# Patient Record
Sex: Female | Born: 1995 | Race: White | Hispanic: No | Marital: Single | Smoking: Former smoker
Health system: Southern US, Community
[De-identification: ages and names within clinical notes are randomized; demographics above are authoritative.]

## PROBLEM LIST (undated history)

## (undated) DIAGNOSIS — F1910 Other psychoactive substance abuse, uncomplicated: Secondary | ICD-10-CM

## (undated) DIAGNOSIS — F4310 Post-traumatic stress disorder, unspecified: Secondary | ICD-10-CM

## (undated) DIAGNOSIS — N926 Irregular menstruation, unspecified: Secondary | ICD-10-CM

## (undated) DIAGNOSIS — F320 Major depressive disorder, single episode, mild: Secondary | ICD-10-CM

## (undated) DIAGNOSIS — F319 Bipolar disorder, unspecified: Secondary | ICD-10-CM

## (undated) DIAGNOSIS — R87619 Unspecified abnormal cytological findings in specimens from cervix uteri: Secondary | ICD-10-CM

## (undated) HISTORY — PX: MOUTH SURGERY: SHX715

## (undated) HISTORY — DX: Unspecified abnormal cytological findings in specimens from cervix uteri: R87.619

## (undated) HISTORY — DX: Other psychoactive substance abuse, uncomplicated: F19.10

## (undated) HISTORY — DX: Irregular menstruation, unspecified: N92.6

## (undated) HISTORY — DX: Bipolar disorder, unspecified: F31.9

## (undated) HISTORY — PX: NO PAST SURGERIES: SHX2092

## (undated) HISTORY — DX: Post-traumatic stress disorder, unspecified: F43.10

---
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
## 2015-03-08 ENCOUNTER — Ambulatory Visit (INDEPENDENT_AMBULATORY_CARE_PROVIDER_SITE_OTHER): Payer: 59 | Admitting: *Deleted

## 2015-03-08 VITALS — BP 100/70 | HR 80 | Ht 64.75 in | Wt 114.4 lb

## 2015-03-08 DIAGNOSIS — Z23 Encounter for immunization: Secondary | ICD-10-CM

## 2015-03-08 DIAGNOSIS — D509 Iron deficiency anemia, unspecified: Secondary | ICD-10-CM

## 2015-03-08 LAB — CBC
HCT: 37 % (ref 36.0–46.0)
Hemoglobin: 12.4 g/dL (ref 12.0–15.0)
MCH: 27.3 pg (ref 26.0–34.0)
MCHC: 33.5 g/dL (ref 30.0–36.0)
MCV: 81.5 fL (ref 78.0–100.0)
MPV: 10.2 fL (ref 8.6–12.4)
Platelets: 249 10*3/uL (ref 150–400)
RBC: 4.54 MIL/uL (ref 3.87–5.11)
RDW: 15.7 % — ABNORMAL HIGH (ref 11.5–15.5)
WBC: 5.4 10*3/uL (ref 4.0–10.5)

## 2015-03-08 LAB — FERRITIN: Ferritin: 21 ng/mL (ref 10–291)

## 2015-03-08 NOTE — Progress Notes (Signed)
Patient is here for her 3rd Gardasil Injection No problems or issues with previous Gardasil  LMP: 03/07/15  Patient tolerated injection well in Right Deltoid Routed to provider for review, encounter closed.

## 2015-03-08 NOTE — Addendum Note (Signed)
Addended by: Lorraine LaxSHAW, Trayshawn Durkin J on: 03/08/2015 08:52 AM   Modules accepted: Level of Service

## 2015-03-12 LAB — IBC PANEL
%SAT: 8 % — ABNORMAL LOW (ref 20–55)
TIBC: 323 ug/dL (ref 250–470)
UIBC: 296 ug/dL (ref 125–400)

## 2015-03-12 LAB — IRON: Iron: 27 ug/dL — ABNORMAL LOW (ref 42–145)

## 2015-03-13 ENCOUNTER — Other Ambulatory Visit: Payer: Self-pay | Admitting: Certified Nurse Midwife

## 2015-03-13 ENCOUNTER — Telehealth: Payer: Self-pay

## 2015-03-13 DIAGNOSIS — D509 Iron deficiency anemia, unspecified: Secondary | ICD-10-CM

## 2015-03-13 NOTE — Telephone Encounter (Signed)
-----   Message from Verner Choleborah S Leonard, CNM sent at 03/13/2015  8:15 AM EDT ----- Notify patient that her CBC is essentially normal Ferritin level low normal  Now 21 normal 10- 291 Iron saturation still low at 8 normal 20-55 % Iron level still low at 27 normal is 42-145  Is she still taking her iron?? If so take, change over to slow release iron capsules and take at night with fruit juice for better absorption Recheck 2 months order, schedule please

## 2015-03-13 NOTE — Telephone Encounter (Signed)
lmtcb

## 2015-03-16 NOTE — Telephone Encounter (Signed)
Patient notified of results. See lab 

## 2015-07-23 ENCOUNTER — Ambulatory Visit (INDEPENDENT_AMBULATORY_CARE_PROVIDER_SITE_OTHER): Payer: 59 | Admitting: Nurse Practitioner

## 2015-07-23 ENCOUNTER — Encounter: Payer: Self-pay | Admitting: Nurse Practitioner

## 2015-07-23 VITALS — BP 114/70 | HR 72 | Resp 16 | Ht 66.5 in | Wt 114.0 lb

## 2015-07-23 DIAGNOSIS — D509 Iron deficiency anemia, unspecified: Secondary | ICD-10-CM

## 2015-07-23 DIAGNOSIS — Z0000 Encounter for general adult medical examination without abnormal findings: Secondary | ICD-10-CM | POA: Diagnosis not present

## 2015-07-23 DIAGNOSIS — Z01419 Encounter for gynecological examination (general) (routine) without abnormal findings: Secondary | ICD-10-CM | POA: Diagnosis not present

## 2015-07-23 LAB — CBC WITH DIFFERENTIAL/PLATELET
Basophils Absolute: 0 10*3/uL (ref 0.0–0.1)
Basophils Relative: 0 % (ref 0–1)
Eosinophils Absolute: 0.1 10*3/uL (ref 0.0–0.7)
Eosinophils Relative: 1 % (ref 0–5)
HCT: 34.3 % — ABNORMAL LOW (ref 36.0–46.0)
Hemoglobin: 11.7 g/dL — ABNORMAL LOW (ref 12.0–15.0)
Lymphocytes Relative: 24 % (ref 12–46)
Lymphs Abs: 1.5 10*3/uL (ref 0.7–4.0)
MCH: 27.9 pg (ref 26.0–34.0)
MCHC: 34.1 g/dL (ref 30.0–36.0)
MCV: 81.9 fL (ref 78.0–100.0)
MPV: 10.3 fL (ref 8.6–12.4)
Monocytes Absolute: 0.4 10*3/uL (ref 0.1–1.0)
Monocytes Relative: 7 % (ref 3–12)
Neutro Abs: 4.4 10*3/uL (ref 1.7–7.7)
Neutrophils Relative %: 68 % (ref 43–77)
Platelets: 246 10*3/uL (ref 150–400)
RBC: 4.19 MIL/uL (ref 3.87–5.11)
RDW: 13.9 % (ref 11.5–15.5)
WBC: 6.4 10*3/uL (ref 4.0–10.5)

## 2015-07-23 LAB — POCT URINALYSIS DIPSTICK
Bilirubin, UA: NEGATIVE
Blood, UA: NEGATIVE
Glucose, UA: NEGATIVE
Ketones, UA: NEGATIVE
Leukocytes, UA: NEGATIVE
Nitrite, UA: NEGATIVE
Protein, UA: NEGATIVE
Urobilinogen, UA: NEGATIVE
pH, UA: 5

## 2015-07-23 NOTE — Progress Notes (Signed)
19 y.o. G0P0 Single  Caucasian Fe here for annual exam.  Now on Kariva since January.  Menses now 4 days.moderate to light.  Slight cramps. Same partner for 11 months. No vaginal symptoms. Declines STD's.  Taking iron tablets every once in a while.  Patient's last menstrual period was 06/25/2015 (exact date).          Sexually active: Yes.    The current method of family planning is OCP- Garnette Scheuermann (estrogen/progesterone).    Exercising: No.  n/a Smoker:  no  Health Maintenance: Pap:  never TDaP:  Pt is unsure Labs:  Pt would like labs drawn today after office visit.  UA: Neg.   reports that she has never smoked. She has never used smokeless tobacco. She reports that she does not use illicit drugs.  Past Medical History  Diagnosis Date  . Irregular menstrual cycle 06/19/2014    Past Surgical History  Procedure Laterality Date  . Mouth surgery  4 years ago     Current Outpatient Prescriptions  Medication Sig Dispense Refill  . desogestrel-ethinyl estradiol (KARIVA) 0.15-0.02/0.01 MG (21/5) tablet Take 1 tablet by mouth daily. 3 Package 2  . ibuprofen (ADVIL,MOTRIN) 200 MG tablet Take 200 mg by mouth every 6 (six) hours as needed. For pain     No current facility-administered medications for this visit.    Family History  Problem Relation Age of Onset  . Prostate cancer Father   . Diabetes Mellitus II Paternal Grandmother   . Diabetes Mellitus II Paternal Grandfather   . Diabetes Mellitus II Father   . Depression Paternal Grandmother   . Fibroids Maternal Grandmother   . Hypertension Paternal Grandmother   . Hypertension Paternal Grandfather   . Hypertension Father   . Osteoporosis Maternal Grandmother     ROS:  Pertinent items are noted in HPI.  Otherwise, a comprehensive ROS was negative.  Exam:   BP 114/70 mmHg  Pulse 72  Resp 16  Ht 5' 6.5" (1.689 m)  Wt 114 lb (51.71 kg)  BMI 18.13 kg/m2  LMP 06/25/2015 (Exact Date) Height: 5' 6.5" (168.9 cm) Ht Readings from  Last 3 Encounters:  07/23/15 5' 6.5" (1.689 m) (81 %*, Z = 0.87)  03/08/15 5' 4.75" (1.645 m) (58 %*, Z = 0.19)  12/05/14 5' 4.75" (1.645 m) (58 %*, Z = 0.20)   * Growth percentiles are based on CDC 2-20 Years data.    General appearance: alert, cooperative and appears stated age Head: Normocephalic, without obvious abnormality, atraumatic Neck: no adenopathy, supple, symmetrical, trachea midline and thyroid normal to inspection and palpation Lungs: clear to auscultation bilaterally Breasts: normal appearance, no masses or tenderness Heart: regular rate and rhythm Abdomen: soft, non-tender; no masses,  no organomegaly Extremities: extremities normal, atraumatic, no cyanosis or edema Skin: Skin color, texture, turgor normal. No rashes or lesions Lymph nodes: Cervical, supraclavicular, and axillary nodes normal. No abnormal inguinal nodes palpated Neurologic: Grossly normal   Pelvic: External genitalia:  no lesions              Urethra:  normal appearing urethra with no masses, tenderness or lesions              Bartholin's and Skene's: normal                 Vagina: normal appearing vagina with normal color and discharge, no lesions              Cervix: anteverted  Pap taken: No. Bimanual Exam:  Uterus:  normal size, contour, position, consistency, mobility, non-tender              Adnexa: no mass, fullness, tenderness               Rectovaginal: Confirms               Anus:  normal sphincter tone, no lesions  Chaperone present: No  A:  Well Woman with normal exam  Contraception with OCP  Iron deficiency anemia   P:   Reviewed health and wellness pertinent to exam  Pap smear as above  Refill on Kariva for a year  Follow with CBC  Counseled on STD prevention, use and side effects of OCP's, adequate intake of calcium and vitamin D, diet and exercise return annually or prn  An After Visit Summary was printed and given to the patient.

## 2015-07-23 NOTE — Patient Instructions (Addendum)
General topics  Next pap or exam is  due in 1 year Take a Women's multivitamin Take 1200 mg. of calcium daily - prefer dietary If any concerns in interim to call back  Breast Self-Awareness Practicing breast self-awareness may pick up problems early, prevent significant medical complications, and possibly save your life. By practicing breast self-awareness, you can become familiar with how your breasts look and feel and if your breasts are changing. This allows you to notice changes early. It can also offer you some reassurance that your breast health is good. One way to learn what is normal for your breasts and whether your breasts are changing is to do a breast self-exam. If you find a lump or something that was not present in the past, it is best to contact your caregiver right away. Other findings that should be evaluated by your caregiver include nipple discharge, especially if it is bloody; skin changes or reddening; areas where the skin seems to be pulled in (retracted); or new lumps and bumps. Breast pain is seldom associated with cancer (malignancy), but should also be evaluated by a caregiver. BREAST SELF-EXAM The best time to examine your breasts is 5 7 days after your menstrual period is over.  ExitCare Patient Information 2013 [employer].   Exercise to Stay Healthy Exercise helps you become and stay healthy. EXERCISE IDEAS AND TIPS Choose exercises that:  You enjoy.  Fit into your day. You do not need to exercise really hard to be healthy. You can do exercises at a slow or medium level and stay healthy. You can:  Stretch before and after working out.  Try yoga, Pilates, or tai chi.  Lift weights.  Walk fast, swim, jog, run, climb stairs, bicycle, dance, or rollerskate.  Take aerobic classes. Exercises that burn about 150 calories:  Running 1  miles in 15 minutes.  Playing volleyball for 45 to 60 minutes.  Washing and waxing a car for 45 to 60  minutes.  Playing touch football for 45 minutes.  Walking 1  miles in 35 minutes.  Pushing a stroller 1  miles in 30 minutes.  Playing basketball for 30 minutes.  Raking leaves for 30 minutes.  Bicycling 5 miles in 30 minutes.  Walking 2 miles in 30 minutes.  Dancing for 30 minutes.  Shoveling snow for 15 minutes.  Swimming laps for 20 minutes.  Walking up stairs for 15 minutes.  Bicycling 4 miles in 15 minutes.  Gardening for 30 to 45 minutes.  Jumping rope for 15 minutes.  Washing windows or floors for 45 to 60 minutes. Document Released: 12/27/2010 Document Revised: 02/16/2012 Document Reviewed: 12/27/2010 ExitCare Patient Information 2013 [employer].   Other topics ( that may be useful information):    Sexually Transmitted Disease Sexually transmitted disease (STD) refers to any infection that is passed from person to person during sexual activity. This may happen by way of saliva, semen, blood, vaginal mucus, or urine. Common STDs include:  Gonorrhea.  Chlamydia.  Syphilis.  HIV/AIDS.  Genital herpes.  Hepatitis B and C.  Trichomonas.  Human papillomavirus (HPV).  Pubic lice. CAUSES  An STD may be spread by bacteria, virus, or parasite. A person can get an STD by:  Sexual intercourse with an infected person.  Sharing sex toys with an infected person.  Sharing needles with an infected person.  Having intimate contact with the genitals, mouth, or rectal areas of an infected person. SYMPTOMS  Some people may not have any symptoms, but   they can still pass the infection to others. Different STDs have different symptoms. Symptoms include:  Painful or bloody urination.  Pain in the pelvis, abdomen, vagina, anus, throat, or eyes.  Skin rash, itching, irritation, growths, or sores (lesions). These usually occur in the genital or anal area.  Abnormal vaginal discharge.  Penile discharge in men.  Soft, flesh-colored skin growths in the  genital or anal area.  Fever.  Pain or bleeding during sexual intercourse.  Swollen glands in the groin area.  Yellow skin and eyes (jaundice). This is seen with hepatitis. DIAGNOSIS  To make a diagnosis, your caregiver may:  Take a medical history.  Perform a physical exam.  Take a specimen (culture) to be examined.  Examine a sample of discharge under a microscope.  Perform blood test TREATMENT   Chlamydia, gonorrhea, trichomonas, and syphilis can be cured with antibiotic medicine.  Genital herpes, hepatitis, and HIV can be treated, but not cured, with prescribed medicines. The medicines will lessen the symptoms.  Genital warts from HPV can be treated with medicine or by freezing, burning (electrocautery), or surgery. Warts may come back.  HPV is a virus and cannot be cured with medicine or surgery.However, abnormal areas may be followed very closely by your caregiver and may be removed from the cervix, vagina, or vulva through office procedures or surgery. If your diagnosis is confirmed, your recent sexual partners need treatment. This is true even if they are symptom-free or have a negative culture or evaluation. They should not have sex until their caregiver says it is okay. HOME CARE INSTRUCTIONS  All sexual partners should be informed, tested, and treated for all STDs.  Take your antibiotics as directed. Finish them even if you start to feel better.  Only take over-the-counter or prescription medicines for pain, discomfort, or fever as directed by your caregiver.  Rest.  Eat a balanced diet and drink enough fluids to keep your urine clear or pale yellow.  Do not have sex until treatment is completed and you have followed up with your caregiver. STDs should be checked after treatment.  Keep all follow-up appointments, Pap tests, and blood tests as directed by your caregiver.  Only use latex condoms and water-soluble lubricants during sexual activity. Do not use  petroleum jelly or oils.  Avoid alcohol and illegal drugs.  Get vaccinated for HPV and hepatitis. If you have not received these vaccines in the past, talk to your caregiver about whether one or both might be right for you.  Avoid risky sex practices that can break the skin. The only way to avoid getting an STD is to avoid all sexual activity.Latex condoms and dental dams (for oral sex) will help lessen the risk of getting an STD, but will not completely eliminate the risk. SEEK MEDICAL CARE IF:   You have a fever.  You have any new or worsening symptoms. Document Released: 02/14/2003 Document Revised: 02/16/2012 Document Reviewed: 02/21/2011 [facility] Patient Information 2013 [place].    Domestic Abuse You are being battered or abused if someone close to you hits, pushes, or physically hurts you in any way. You also are being abused if you are forced into activities. You are being sexually abused if you are forced to have sexual contact of any kind. You are being emotionally abused if you are made to feel worthless or if you are constantly threatened. It is important to remember that help is available. No one has the right to abuse you. PREVENTION OF FURTHER  ABUSE  Learn the warning signs of danger. This varies with situations but may include: the use of alcohol, threats, isolation from friends and family, or forced sexual contact. Leave if you feel that violence is going to occur.  If you are attacked or beaten, report it to the police so the abuse is documented. You do not have to press charges. The police can protect you while you or the attackers are leaving. Get the officer's name and badge number and a copy of the report.  Find someone you can trust and tell them what is happening to you: your caregiver, a nurse, clergy member, close friend or family member. Feeling ashamed is natural, but remember that you have done nothing wrong. No one deserves abuse. Document Released:  11/21/2000 Document Revised: 02/16/2012 Document Reviewed: 01/30/2011 ExitCare Patient Information 2013 [employer].    How Much is Too Much Alcohol? Drinking too much alcohol can cause injury, accidents, and health problems. These types of problems can include:   Car crashes.  Falls.  Family fighting (domestic violence).  Drowning.  Fights.  Injuries.  Burns.  Damage to certain organs.  Having a baby with birth defects. ONE DRINK CAN BE TOO MUCH WHEN YOU ARE:  Working.  Pregnant or breastfeeding.  Taking medicines. Ask your doctor.  Driving or planning to drive. If you or someone you know has a drinking problem, get help from a doctor.  Document Released: 09/20/2009 Document Revised: 02/16/2012 Document Reviewed: 09/20/2009 ExitCare Patient Information 2013 [employer].   Smoking Hazards Smoking cigarettes is extremely bad for your health. Tobacco smoke has over 200 known poisons in it. There are over 60 chemicals in tobacco smoke that cause cancer. Some of the chemicals found in cigarette smoke include:   Cyanide.  Benzene.  Formaldehyde.  Methanol (wood alcohol).  Acetylene (fuel used in welding torches).  Ammonia. Cigarette smoke also contains the poisonous gases nitrogen oxide and carbon monoxide.  Cigarette smokers have an increased risk of many serious medical problems and Smoking causes approximately:  90% of all lung cancer deaths in men.  80% of all lung cancer deaths in women.  90% of deaths from chronic obstructive lung disease. Compared with nonsmokers, smoking increases the risk of:  Coronary heart disease by 2 to 4 times.  Stroke by 2 to 4 times.  Men developing lung cancer by 23 times.  Women developing lung cancer by 13 times.  Dying from chronic obstructive lung diseases by 12 times.  . Smoking is the most preventable cause of death and disease in our society.  WHY IS SMOKING ADDICTIVE?  Nicotine is the chemical  agent in tobacco that is capable of causing addiction or dependence.  When you smoke and inhale, nicotine is absorbed rapidly into the bloodstream through your lungs. Nicotine absorbed through the lungs is capable of creating a powerful addiction. Both inhaled and non-inhaled nicotine may be addictive.  Addiction studies of cigarettes and spit tobacco show that addiction to nicotine occurs mainly during the teen years, when young people begin using tobacco products. WHAT ARE THE BENEFITS OF QUITTING?  There are many health benefits to quitting smoking.   Likelihood of developing cancer and heart disease decreases. Health improvements are seen almost immediately.  Blood pressure, pulse rate, and breathing patterns start returning to normal soon after quitting. QUITTING SMOKING   American Lung Association - 1-800-LUNGUSA  American Cancer Society - 1-800-ACS-2345 Document Released: 01/01/2005 Document Revised: 02/16/2012 Document Reviewed: 09/05/2009 ExitCare Patient Information 2013 ExitCare,   LLC.   Stress Management Stress is a state of physical or mental tension that often results from changes in your life or normal routine. Some common causes of stress are:  Death of a loved one.  Injuries or severe illnesses.  Getting fired or changing jobs.  Moving into a new home. Other causes may be:  Sexual problems.  Business or financial losses.  Taking on a large debt.  Regular conflict with someone at home or at work.  Constant tiredness from lack of sleep. It is not just bad things that are stressful. It may be stressful to:  Win the lottery.  Get married.  Buy a new car. The amount of stress that can be easily tolerated varies from person to person. Changes generally cause stress, regardless of the types of change. Too much stress can affect your health. It may lead to physical or emotional problems. Too little stress (boredom) may also become stressful. SUGGESTIONS TO  REDUCE STRESS:  Talk things over with your family and friends. It often is helpful to share your concerns and worries. If you feel your problem is serious, you may want to get help from a professional counselor.  Consider your problems one at a time instead of lumping them all together. Trying to take care of everything at once may seem impossible. List all the things you need to do and then start with the most important one. Set a goal to accomplish 2 or 3 things each day. If you expect to do too many in a single day you will naturally fail, causing you to feel even more stressed.  Do not use alcohol or drugs to relieve stress. Although you may feel better for a short time, they do not remove the problems that caused the stress. They can also be habit forming.  Exercise regularly - at least 3 times per week. Physical exercise can help to relieve that "uptight" feeling and will relax you.  The shortest distance between despair and hope is often a good night's sleep.  Go to bed and get up on time allowing yourself time for appointments without being rushed.  Take a short "time-out" period from any stressful situation that occurs during the day. Close your eyes and take some deep breaths. Starting with the muscles in your face, tense them, hold it for a few seconds, then relax. Repeat this with the muscles in your neck, shoulders, hand, stomach, back and legs.  Take good care of yourself. Eat a balanced diet and get plenty of rest.  Schedule time for having fun. Take a break from your daily routine to relax. HOME CARE INSTRUCTIONS   Call if you feel overwhelmed by your problems and feel you can no longer manage them on your own.  Return immediately if you feel like hurting yourself or someone else. Document Released: 05/20/2001 Document Revised: 02/16/2012 Document Reviewed: 01/10/2008 [facility] Patient Information 2013 [place].   Please check on date for TDaP

## 2015-07-24 NOTE — Progress Notes (Signed)
Encounter reviewed by Dr. Brook Amundson C. Silva.  

## 2015-10-07 ENCOUNTER — Emergency Department (HOSPITAL_COMMUNITY)
Admission: EM | Admit: 2015-10-07 | Discharge: 2015-10-08 | Disposition: A | Discharge status: Home / Self Care | Payer: 59 | Attending: Emergency Medicine | Admitting: Emergency Medicine

## 2015-10-07 ENCOUNTER — Encounter (HOSPITAL_COMMUNITY): Payer: Self-pay

## 2015-10-07 ENCOUNTER — Ambulatory Visit (HOSPITAL_COMMUNITY)
Admission: EM | Admit: 2015-10-07 | Discharge: 2015-10-07 | Disposition: A | Discharge status: Home / Self Care | Payer: 59 | Source: Home / Self Care | Attending: Emergency Medicine | Admitting: Emergency Medicine

## 2015-10-07 DIAGNOSIS — Z3202 Encounter for pregnancy test, result negative: Secondary | ICD-10-CM | POA: Insufficient documentation

## 2015-10-07 DIAGNOSIS — Z880 Allergy status to penicillin: Secondary | ICD-10-CM | POA: Diagnosis not present

## 2015-10-07 DIAGNOSIS — T7421XA Adult sexual abuse, confirmed, initial encounter: Secondary | ICD-10-CM | POA: Diagnosis not present

## 2015-10-07 DIAGNOSIS — Z8742 Personal history of other diseases of the female genital tract: Secondary | ICD-10-CM | POA: Diagnosis not present

## 2015-10-07 DIAGNOSIS — Z79818 Long term (current) use of other agents affecting estrogen receptors and estrogen levels: Secondary | ICD-10-CM | POA: Diagnosis not present

## 2015-10-07 MED ORDER — PROMETHAZINE HCL 25 MG PO TABS
25.0000 mg | ORAL_TABLET | Freq: Four times a day (QID) | ORAL | Status: DC | PRN
Start: 2015-10-07 — End: 2015-10-08
  Administered 2015-10-07: 75 mg via ORAL

## 2015-10-07 MED ORDER — AZITHROMYCIN 1 G PO PACK
PACK | ORAL | Status: AC
  Administered 2015-10-07: 1 g via ORAL
  Filled 2015-10-07: qty 1

## 2015-10-07 MED ORDER — ULIPRISTAL ACETATE 30 MG PO TABS
ORAL_TABLET | ORAL | Status: AC
  Filled 2015-10-07: qty 1

## 2015-10-07 MED ORDER — PROMETHAZINE HCL 25 MG PO TABS
ORAL_TABLET | ORAL | Status: AC
  Administered 2015-10-07: 75 mg via ORAL
  Filled 2015-10-07: qty 3

## 2015-10-07 MED ORDER — METRONIDAZOLE 500 MG PO TABS
2000.0000 mg | ORAL_TABLET | Freq: Once | ORAL | Status: AC
  Administered 2015-10-07: 2000 mg via ORAL
  Filled 2015-10-07: qty 4

## 2015-10-07 MED ORDER — METRONIDAZOLE 500 MG PO TABS
ORAL_TABLET | ORAL | Status: AC
  Filled 2015-10-07: qty 4

## 2015-10-07 MED ORDER — CEFIXIME 400 MG PO CAPS
ORAL_CAPSULE | ORAL | Status: AC
  Filled 2015-10-07: qty 1

## 2015-10-07 MED ORDER — AZITHROMYCIN 1 G PO PACK
1.0000 g | PACK | Freq: Once | ORAL | Status: AC
  Administered 2015-10-07: 1 g via ORAL
  Filled 2015-10-07: qty 1
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
## 2015-10-07 NOTE — ED Notes (Signed)
SANE RN bedside with Pt.

## 2015-10-08 LAB — POC URINE PREG, ED: Preg Test, Ur: NEGATIVE

## 2015-10-11 ENCOUNTER — Telehealth: Payer: Self-pay | Admitting: Nurse Practitioner

## 2015-10-11 NOTE — Telephone Encounter (Signed)
Patient will need to be seen by a medical provider or psychiatrist for evaluation and treatment.  We feel this is very important for our young women who are experiencing acute mental health issues.  If acutely not doing well, needs to be seen at [facility].  If we have permission to discuss care with [name], we may be able to facilitate more easily.
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
## 2015-10-11 NOTE — Telephone Encounter (Signed)
Spoke with patient's mother, Shawna OrleansMelanie. Advised of message as seen below from Dr.Silva. Mother is agreeable. Mother provided me with permission to speak with [name] in regards to her recommendations for a psychiatrist in the area. Advised I will speak with [name] and return call. Left message at the office of [name] at [phone] for return call to discuss recommendations.
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
## 2015-10-12 NOTE — Telephone Encounter (Signed)
Attempted to reach [name] [name] again at office number [phone]. There was no answer. Spoke with patient's mother [name] [name]. Advised I have not received a return call from [name] [name]. Advised I have looked into psychiatry groups in [place], [place]. She may call [facility] to schedule an appointment for her daughter with a Psychiatrist phone number is [phone]. Mother is agreeable. Will notify our office if she has any difficulty scheduling an appointment for her daughter. States that her daughter is stable.  Routing to Dr.Silva as FYI.

## 2015-10-12 NOTE — Telephone Encounter (Signed)
Thank you for the update. I agree with [facility].

## 2015-10-12 NOTE — Telephone Encounter (Signed)
Patient's mom calling asking for an update. I told patient [name] is waiting to hear from the psychiatrist per last note. Patient's mom "Melaine" is asking  what can be done to speed this along?

## 2015-10-14 ENCOUNTER — Encounter (HOSPITAL_COMMUNITY): Payer: Self-pay | Admitting: *Deleted

## 2015-10-14 ENCOUNTER — Inpatient Hospital Stay (HOSPITAL_COMMUNITY)
Admission: RE | Admit: 2015-10-14 | Discharge: 2015-10-17 | DRG: 881 | Disposition: A | Discharge status: Home / Self Care | Payer: 59 | Attending: Psychiatry | Admitting: Psychiatry

## 2015-10-14 DIAGNOSIS — F4322 Adjustment disorder with anxiety: Secondary | ICD-10-CM | POA: Diagnosis present

## 2015-10-14 DIAGNOSIS — R45851 Suicidal ideations: Secondary | ICD-10-CM | POA: Diagnosis present

## 2015-10-14 DIAGNOSIS — Z8249 Family history of ischemic heart disease and other diseases of the circulatory system: Secondary | ICD-10-CM

## 2015-10-14 DIAGNOSIS — F329 Major depressive disorder, single episode, unspecified: Secondary | ICD-10-CM | POA: Diagnosis present

## 2015-10-14 DIAGNOSIS — Z833 Family history of diabetes mellitus: Secondary | ICD-10-CM | POA: Diagnosis not present

## 2015-10-14 DIAGNOSIS — F323 Major depressive disorder, single episode, severe with psychotic features: Secondary | ICD-10-CM | POA: Diagnosis not present

## 2015-10-14 DIAGNOSIS — G4700 Insomnia, unspecified: Secondary | ICD-10-CM | POA: Diagnosis present

## 2015-10-14 DIAGNOSIS — Z818 Family history of other mental and behavioral disorders: Secondary | ICD-10-CM | POA: Diagnosis not present

## 2015-10-14 DIAGNOSIS — F320 Major depressive disorder, single episode, mild: Secondary | ICD-10-CM

## 2015-10-14 HISTORY — DX: Major depressive disorder, single episode, mild: F32.0

## 2015-10-14 LAB — COMPREHENSIVE METABOLIC PANEL
ALT: 15 U/L (ref 14–54)
AST: 18 U/L (ref 15–41)
Albumin: 4.7 g/dL (ref 3.5–5.0)
Alkaline Phosphatase: 72 U/L (ref 38–126)
Anion gap: 9 (ref 5–15)
BUN: 16 mg/dL (ref 6–20)
CO2: 26 mmol/L (ref 22–32)
Calcium: 9.7 mg/dL (ref 8.9–10.3)
Chloride: 105 mmol/L (ref 101–111)
Creatinine, Ser: 0.72 mg/dL (ref 0.44–1.00)
GFR calc Af Amer: >60 mL/min (ref >60)
GFR calc non Af Amer: >60 mL/min (ref >60)
Glucose, Bld: 142 mg/dL — ABNORMAL HIGH (ref 65–99)
Potassium: 3.8 mmol/L (ref 3.5–5.1)
Sodium: 140 mmol/L (ref 135–145)
Total Bilirubin: 1 mg/dL (ref 0.3–1.2)
Total Protein: 7.8 g/dL (ref 6.5–8.1)

## 2015-10-14 LAB — PREGNANCY, URINE: Preg Test, Ur: NEGATIVE

## 2015-10-14 LAB — RAPID URINE DRUG SCREEN, HOSP PERFORMED
Amphetamines: NOT DETECTED
Barbiturates: NOT DETECTED
Benzodiazepines: NOT DETECTED
Cocaine: NOT DETECTED
Opiates: NOT DETECTED
Tetrahydrocannabinol: POSITIVE — AB

## 2015-10-14 MED ORDER — ACETAMINOPHEN 325 MG PO TABS: 650.0000 mg | ORAL_TABLET | Freq: Four times a day (QID) | ORAL | Status: DC | PRN

## 2015-10-14 MED ORDER — HYDROXYZINE HCL 25 MG PO TABS: 25.0000 mg | ORAL_TABLET | Freq: Four times a day (QID) | ORAL | Status: DC | PRN

## 2015-10-14 MED ORDER — BUPROPION HCL ER (XL) 150 MG PO TB24
150.0000 mg | ORAL_TABLET | Freq: Every day | ORAL | Status: DC
  Administered 2015-10-15: 150 mg via ORAL
  Filled 2015-10-14 (×5): qty 1

## 2015-10-14 MED ORDER — ALUM & MAG HYDROXIDE-SIMETH 200-200-20 MG/5ML PO SUSP: 30.0000 mL | ORAL | Status: DC | PRN

## 2015-10-14 MED ORDER — MAGNESIUM HYDROXIDE 400 MG/5ML PO SUSP: 30.0000 mL | Freq: Every day | ORAL | Status: DC | PRN

## 2015-10-14 MED ORDER — NICOTINE 14 MG/24HR TD PT24
14.0000 mg | MEDICATED_PATCH | Freq: Every day | TRANSDERMAL | Status: DC
  Filled 2015-10-14 (×2): qty 1

## 2015-10-14 MED ORDER — TRAZODONE HCL 50 MG PO TABS
50.0000 mg | ORAL_TABLET | Freq: Every evening | ORAL | Status: DC | PRN
  Administered 2015-10-15 – 2015-10-16 (×2): 50 mg via ORAL
  Filled 2015-10-14 (×2): qty 1
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
## 2015-10-14 NOTE — [facility] Group Notes (Signed)
[facility] Group Notes:  (Clinical Social Work)  10/14/2015  1:15-2:15PM  Summary of Progress/Problems:   The main focus of today's process group was to   1)  discuss the importance of adding supports  2)  define health supports versus unhealthy supports  3)  identify the patient's current unhealthy supports and plan how to handle them  4)  Identify the patient's current healthy supports and plan what to add.  An emphasis was placed on using counselor, doctor, therapy groups, 12-step groups, and problem-specific support groups to expand supports.    The patient expressed full comprehension of the concepts presented, and agreed that there is a need to add more supports.  The patient listened, did not talk much.  Type of Therapy:  Process Group with Motivational Interviewing  Participation Level:  Active  Participation Quality:  Attentive  Affect:  Blunted and Depressed  Cognitive:  Oriented  Insight:  Developing/Improving  Engagement in Therapy:  Engaged  Modes of Intervention:   Education, Support and Processing, Activity  Ambrose MantleMareida Grossman-Orr, LCSW 10/14/2015

## 2015-10-14 NOTE — Progress Notes (Signed)
Adult Psychoeducational Group Note  Date:  10/14/2015 Time:  8:58 PM  Group Topic/Focus:  Wrap-Up Group:   The focus of this group is to help patients review their daily goal of treatment and discuss progress on daily workbooks.  Participation Level:  Active  Participation Quality:  Appropriate and Attentive  Affect:  Appropriate  Cognitive:  Appropriate  Insight: Appropriate and Good  Engagement in Group:  Engaged  Modes of Intervention:  Education  Additional Comments:  Pt had a good day. Pt goal for tomorrow is to figure her work schedule out.   [name] [name] 10/14/2015, 8:58 PM
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
## 2015-10-14 NOTE — Progress Notes (Signed)
Writeer has observed patient up in the dayroom watching tv with no interaction with peers. Writer spoke with patient 1:1 and she is voiced no concerns and is settling in to the routine of the unit. Writer informed her of medications available and she currently is not in need of anything. She denies si/hi/a/v hallucinations, safety maintained on unit with 15 min checks.

## 2015-10-15 ENCOUNTER — Encounter (HOSPITAL_COMMUNITY): Payer: Self-pay | Admitting: Psychiatry

## 2015-10-15 DIAGNOSIS — F320 Major depressive disorder, single episode, mild: Secondary | ICD-10-CM

## 2015-10-15 DIAGNOSIS — F323 Major depressive disorder, single episode, severe with psychotic features: Secondary | ICD-10-CM

## 2015-10-15 DIAGNOSIS — F4322 Adjustment disorder with anxiety: Secondary | ICD-10-CM

## 2015-10-15 HISTORY — DX: Major depressive disorder, single episode, mild: F32.0

## 2015-10-15 MED ORDER — CITALOPRAM HYDROBROMIDE 10 MG PO TABS
10.0000 mg | ORAL_TABLET | Freq: Every day | ORAL | Status: DC
  Administered 2015-10-15 – 2015-10-17 (×3): 10 mg via ORAL
  Filled 2015-10-15 (×5): qty 1
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
## 2015-10-15 NOTE — Plan of Care (Signed)
Problem: Diagnosis: Increased Risk For Suicide Attempt Goal: STG-Patient Will Attend All Groups On The Unit Outcome: Progressing Patient attended wrap up group this evening.     

## 2015-10-15 NOTE — Progress Notes (Signed)
Recreation Therapy Notes  Date: 11.07.2016 Time: 9:30am Location: [address] Dayroom  Group Topic: Stress Management  Goal Area(s) Addresses:  Patient will actively participate in stress management techniques presented during session.   Behavioral Response: Appropriate   Intervention: Stress management techniques  Activity :  Deep Breathing and Guided Imagery. LRT provided instruction and demonstration on practice of Guided Imagery. Technique was coupled with deep breathing.   Education:  Stress Management, Discharge Planning.   Education Outcome: Acknowledges education  Clinical Observations/Feedback: Patient actively engaged in technique introduced, expressed no concerns and demonstrated ability to practice independently post d/c.   Marykay Lexenise L Taria Castrillo, LRT/CTRS  Ahmaad Neidhardt L 10/15/2015 10:13 AM
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
## 2015-10-15 NOTE — Progress Notes (Signed)
Met with patient 1:1 this morning. She is flat in affect with depressed mood. Guarded and forwards minimal information. Rates her depression at a 2/10, denies hopelessness and rates her anxiety at a 5/10. States her goal is to "get into a routine and to be active throughout the day." Denies pain or physical problems. Patient medicated per orders. Offered support, reassurance. She denies SI/HI/AVH and remains safe. Jamie Kato
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
## 2015-10-15 NOTE — Plan of Care (Signed)
Problem: Alteration in mood Goal: STG-Patient is able to discuss feelings and issues (Patient is able to discuss feelings and issues leading to depression)  Outcome: Not Progressing Patient guarded and forwards minimal information.   Problem: Ineffective individual coping Goal: STG: Patient will remain free from self harm Outcome: Progressing Patient denies SI, thoughts to self harm and indicates her last episode of self harm was in March.

## 2015-10-15 NOTE — [facility] Group Notes (Signed)
   [facility]  10/15/2015  8:45 AM   Participation Quality: Alert, Appropriate and Oriented  Mood/Affect: Depressed and Flat  Depression Rating: 3  Anxiety Rating: 5  Thoughts of Suicide: Pt denies SI/HI  Will you contract for safety? Yes  Current AVH: Pt denies  Plan for Discharge/Comments: Pt attended discharge planning group and actively participated in group. CSW provided pt with today's workbook. Patient plans to return home to follow up with her current outpatient providers.  Transportation Means: Pt reports access to transportation  Supports: No supports mentioned at this time  Samuella BruinKristin Aybree Lanyon, MSW, [employer]CSWA Clinical Social Worker [facility] [phone]

## 2015-10-15 NOTE — BHH Group Notes (Signed)
Adult Psychoeducational Group Note  Date:  10/15/2015 Time:  10:13 PM  Group Topic/Focus:  Wrap-Up Group:   The focus of this group is to help patients review their daily goal of treatment and discuss progress on daily workbooks.  Participation Level:  Minimal  Participation Quality:  Appropriate  Affect:  Appropriate  Cognitive:  Appropriate  Insight: Good  Engagement in Group:  Limited  Modes of Intervention:  Discussion  Additional Comments:  Fleet ContrasRachel stated her day was "pretty good".  She said she talked to her boyfriend and her goal was to start going to all the meetings and not isolate herself.  Caroll RancherLindsay, Lark Runk A 10/15/2015, 10:13 PM

## 2015-10-16 DIAGNOSIS — F323 Major depressive disorder, single episode, severe with psychotic features: Secondary | ICD-10-CM

## 2015-10-16 LAB — GC/CHLAMYDIA PROBE AMP (~~LOC~~) NOT AT ARMC
Chlamydia: NEGATIVE
Neisseria Gonorrhea: NEGATIVE

## 2015-10-16 LAB — RPR: RPR Ser Ql: NONREACTIVE

## 2015-10-16 LAB — TSH: TSH: 1.261 u[IU]/mL (ref 0.350–4.500)

## 2015-10-16 LAB — HIV ANTIBODY (ROUTINE TESTING W REFLEX): HIV Screen 4th Generation wRfx: NONREACTIVE

## 2015-10-16 MED ORDER — CITALOPRAM HYDROBROMIDE 10 MG PO TABS: 10.0000 mg | ORAL_TABLET | Freq: Every day | ORAL | Status: DC

## 2015-10-16 MED ORDER — TRAZODONE HCL 50 MG PO TABS: 50.0000 mg | ORAL_TABLET | Freq: Every evening | ORAL | Status: DC | PRN

## 2015-10-16 NOTE — Progress Notes (Signed)
   D: Pt was quiet with the writer, but was observed in the dayroom interacting with her peers. Pt has no other questions or concerns.   A:  Support and encouragement was offered. 15 min checks continued for safety.  R: Pt remains safe.

## 2015-10-16 NOTE — Progress Notes (Signed)
[facility] MD Progress Note  10/16/2015 5:26 PM [name] [name]  MRN:  [id number] Subjective:   Patient reports she is feeling better today. At present denies any medication side effects.  Currently denies any suicidal ideations. She is focused on being discharged soon, plans to return to college . Objective : Have discussed case with treatment team and have met with patient. As noted, patient reports improved mood, and is feeling " better ". At this time not endorsing any significant symptoms of Acute Stress Disorder . Denies suicidal ideations and is future oriented, wanting to return to college after discharge . Behavior on unit calm, pleasant- going to groups. She has minimized depression, but staff has noted some constricted affect . Chlamydia, Gonorrhea,  RPR, HIV negative .  TSH WNL . Principal Problem: Major psychotic depression, single episode ([place]) Diagnosis:   Patient Active Problem List   Diagnosis Date Noted  . 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 Adjustment disorder with anxious mood [F43.22] 10/14/2015  . Irregular menstrual cycle [N92.6] 06/19/2014   Total Time spent with patient: 20 minutes    Past Medical History:  Past Medical History  Diagnosis Date  . Irregular menstrual cycle 06/19/2014  . Mild major depression, single episode ([place]) 10/15/2015    Past Surgical History  Procedure Laterality Date  . Mouth surgery  4 years ago    Family History:  Family History  Problem Relation Age of Onset  . Prostate cancer Father   . Diabetes Mellitus II Paternal Grandmother   . Diabetes Mellitus II Paternal Grandfather   . Diabetes Mellitus II Father   . Depression Paternal Grandmother   . Fibroids Maternal Grandmother   . Hypertension Paternal Grandmother   . Hypertension Paternal Grandfather   . Hypertension Father   . Osteoporosis Maternal Grandmother     Social  History:  History  Alcohol Use  . 0.6 oz/week  . 1 Glasses of wine per week    Comment: ocasionally     History  Drug Use  . Yes  . Special: Marijuana    Comment: 2-3 times a week    Social History   Social History  . Marital Status: Single    Spouse Name: N/A  . Number of Children: N/A  . Years of Education: N/A   Social History Main Topics  . Smoking status: Never Smoker   . Smokeless tobacco: Never Used  . Alcohol Use: 0.6 oz/week    1 Glasses of wine per week     Comment: ocasionally  . Drug Use: Yes    Special: Marijuana     Comment: 2-3 times a week  . Sexual Activity:    Partners: Male    Birth Control/ Protection: Pill   Other Topics Concern  . None   Social History Narrative   Additional Social History:    Pain Medications: "No" Prescriptions: n/a Over the Counter: pt denies abuse - see pta meds list History of alcohol / drug use?: No history of alcohol / drug abuse Name of Substance 1: marijuana 1 - Age of First Use: 18 1 - Amount (size/oz): "half a bowl" 1 - Frequency: two to three times weekly 1 - Duration: months 1 - Last Use / Amount: 10/06/15  Name of Substance 2: alcohol 2 - Age of First Use: 18 2 - Amount (size/oz): varies 2 - Frequency: once a month 2 - Last Use / Amount:  10/06/15  Sleep: Good  Appetite:  Good  Current Medications: Current Facility-Administered Medications  Medication Dose Route Frequency Provider Last Rate Last Dose  . acetaminophen (TYLENOL) tablet 650 mg  650 mg Oral Q6H PRN Niel Hummer, NP      . alum & mag hydroxide-simeth (MAALOX/MYLANTA) 200-200-20 MG/5ML suspension 30 mL  30 mL Oral Q4H PRN Niel Hummer, NP      . citalopram (CELEXA) tablet 10 mg  10 mg Oral Daily Jenne Campus, MD   10 mg at 10/16/15 0747  . hydrOXYzine (ATARAX/VISTARIL) tablet 25 mg  25 mg Oral Q6H PRN Niel Hummer, NP      . magnesium hydroxide (MILK OF MAGNESIA) suspension 30 mL  30 mL Oral Daily PRN Niel Hummer, NP      .  traZODone (DESYREL) tablet 50 mg  50 mg Oral QHS PRN Niel Hummer, NP   50 mg at 10/15/15 2244    Lab Results:  Results for orders placed or performed during the hospital encounter of 10/14/15 (from the past 48 hour(s))  Comprehensive metabolic panel     Status: Abnormal   Collection Time: 10/14/15  6:27 PM  Result Value Ref Range   Sodium 140 135 - 145 mmol/L   Potassium 3.8 3.5 - 5.1 mmol/L   Chloride 105 101 - 111 mmol/L   CO2 26 22 - 32 mmol/L   Glucose, Bld 142 (H) 65 - 99 mg/dL   BUN 16 6 - 20 mg/dL   Creatinine, Ser 0.72 0.44 - 1.00 mg/dL   Calcium 9.7 8.9 - 10.3 mg/dL   Total Protein 7.8 6.5 - 8.1 g/dL   Albumin 4.7 3.5 - 5.0 g/dL   AST 18 15 - 41 U/L   ALT 15 14 - 54 U/L   Alkaline Phosphatase 72 38 - 126 U/L   Total Bilirubin 1.0 0.3 - 1.2 mg/dL   GFR calc non Af Amer >60 >60 mL/min   GFR calc Af Amer >60 >60 mL/min    Comment: (NOTE) The eGFR has been calculated using the CKD EPI equation. This calculation has not been validated in all clinical situations. eGFR's persistently <60 mL/min signify possible Chronic Kidney Disease.    Anion gap 9 5 - 15    Comment: Performed at [facility]  GC/Chlamydia probe amp ([place])not at [facility]     Status: None   Collection Time: 10/15/15 12:00 AM  Result Value Ref Range   Chlamydia Negative     Comment: Normal Reference Range - Negative   Neisseria gonorrhea Negative     Comment: Normal Reference Range - Negative  HIV antibody     Status: None   Collection Time: 10/15/15  6:30 PM  Result Value Ref Range   HIV Screen 4th Generation wRfx Non Reactive Non Reactive    Comment: (NOTE) Performed At: [facility] [place], [place] [id number] Lindon Romp MD TD:[id number] Performed at [facility]   TSH     Status: None   Collection Time: 10/16/15  6:25 AM  Result Value Ref Range   TSH 1.261 0.350 - 4.500 uIU/mL    Comment: Performed at [facility]  RPR     Status: None   Collection Time: 10/16/15  6:25 AM  Result Value Ref Range   RPR Ser Ql Non Reactive Non Reactive    Comment: (NOTE) Performed At: [place],  [place] [id number] Lindon Romp MD [id number] Performed at [facility]     Physical Findings: AIMS: Facial and Oral Movements Muscles of Facial Expression: None, normal Lips and Perioral Area: None, normal Jaw: None, normal Tongue: None, normal,Extremity Movements Upper (arms, wrists, hands, fingers): None, normal Lower (legs, knees, ankles, toes): None, normal, Trunk Movements Neck, shoulders, hips: None, normal, Overall Severity Severity of abnormal movements (highest score from questions above): None, normal Incapacitation due to abnormal movements: None, normal Patient's awareness of abnormal movements (rate only patient's report): No Awareness, Dental Status Current problems with teeth and/or dentures?: No Does patient usually wear dentures?: No  CIWA:  CIWA-Ar Total: 0 COWS:  COWS Total Score: 1  Musculoskeletal: Strength & Muscle Tone: within normal limits Gait & Station: normal Patient leans: N/A  Psychiatric Specialty Exam: ROS denies headaches, denies chest pain, denies SOB, no vomiting  Blood pressure 108/71, pulse 88, temperature 97.9 F (36.6 C), temperature source Oral, resp. rate 16, height '5\' 4"'  (1.626 m), weight 113 lb (51.256 kg), last menstrual period 09/22/2015, SpO2 100 %.Body mass index is 19.39 kg/(m^2).  General Appearance: Well Groomed  Engineer, water::  Good  Speech:  Normal Rate  Volume:  Normal  Mood:  improved, at this time denies severe depression, reports mood as better  Affect:  Appropriate and mildly constricted   Thought Process:  Linear  Orientation:  Full (Time, Place, and Person)  Thought Content:  denies hallucinations, no delusions   Suicidal Thoughts:  No- at this time denies any suicidal ideations,  denies any self injurious ideations  Homicidal Thoughts:  No  Memory:  recent and remote grossly intact   Judgement:  Other:  improved   Insight:  improving   Psychomotor Activity:  Normal  Concentration:  Good  Recall:  Good  Fund of Knowledge:Good  Language: Good  Akathisia:  Negative  Handed:  Right  AIMS (if indicated):     Assets:  Communication Skills Desire for Improvement Physical Health Resilience Vocational/Educational  ADL's:  Intact  Cognition: WNL  Sleep:  Number of Hours: 6.75  Assessment - at this time patient reporting improved mood and presenting with improving range of affect, although staff reports tendency towards flat, constricted affect at times. At this time not endorsing Acute Stress Disorder/ PTSD type symptoms. Denies medication side effects. Her current focus is to be discharged soon, looking forward to returning to college. Improving insight regarding importance of alcohol abstinence  As part of treatment goals- describes relatively frequent alcohol binges with frequent gross intoxication and /or blackouts . Treatment Plan Summary: Daily contact with patient to assess and evaluate symptoms and progress in treatment, Medication management, Plan inpatient treatment  and medications as below Encourage ongoing milieu, group participation to address coping skills and symptom reduction Continue to work on alcohol cessation , abstinence recommendations Continue Celexa 10 mgrs QDAY for depression and anxiety Continue Vistaril PRNS for anxiety as needed  Continue Trazodone 50 mgrs QHS PRN for insomnia as needed  COBOS, FERNANDO 10/16/2015, 5:26 PM

## 2015-10-16 NOTE — Progress Notes (Signed)
D: Pt presents with flat affect and depressed mood. Pt rates depression 2/10. Pt minimal and forwards little information. Pt denies suicidal thoughts. Pt reports fair sleep. Pt tolerating Celexa well and denies any adverse reaction to med. Pt compliant with taking meds and attending groups. No complaints verbalized by pt. Pt stated goal is to take less naps and engage in activities. A: Medications administered as ordered per MD. Verbal support given. Pt encouraged to attend groups. 15 minute checks performed for safety. R:  Pt receptive to tx.

## 2015-10-16 NOTE — Progress Notes (Signed)
Recreation Therapy Notes  Animal-Assisted Activity (AAA) Program Checklist/Progress Notes Patient Eligibility Criteria Checklist & Daily Group note for Rec Tx Intervention  Date: 11.08.2016 Time: 2:45pm Location: 300 Hall Dayroom    AAA/T Program Assumption of Risk Form signed by Patient/ or Parent Legal Guardian yes  Patient is free of allergies or sever asthma yes  Patient reports no fear of animals yes  Patient reports no history of cruelty to animals yes  Patient understands his/her participation is voluntary yes  Patient washes hands before animal contact yes  Patient washes hands after animal contact yes  Behavioral Response: Appropriate   Education: Hand Washing, Appropriate Animal Interaction   Education Outcome: Acknowledges education.   Clinical Observations/Feedback: Patient engaged appropriately with therapy dog and peers during session.    Nikitia Asbill L Emalina Dubreuil, LRT/CTRS        Shenika Quint L 10/16/2015 3:14 PM 

## 2015-10-16 NOTE — BHH Suicide Risk Assessment (Signed)
BHH INPATIENT:  Family/Significant Other Suicide Prevention Education  Suicide Prevention Education:  Education Completed; mother [place] CallasMelanie Bellew [phone],  (name of family member/significant other) has been identified by the patient as the family member/significant other with whom the patient will be residing, and identified as the person(s) who will aid the patient in the event of a mental health crisis (suicidal ideations/suicide attempt).  With written consent from the patient, the family member/significant other has been provided the following suicide prevention education, prior to the and/or following the discharge of the patient.  The suicide prevention education provided includes the following:  Suicide risk factors  Suicide prevention and interventions  National Suicide Hotline telephone number  [facility] assessment telephone number  [place] [facility] Emergency Assistance 911  [facility] and/or Residential Mobile Crisis Unit telephone number  Request made of family/significant other to:  Remove weapons (e.g., guns, rifles, knives), all items previously/currently identified as safety concern.    Remove drugs/medications (over-the-counter, prescriptions, illicit drugs), all items previously/currently identified as a safety concern.  The family member/significant other verbalizes understanding of the suicide prevention education information provided.  The family member/significant other agrees to remove the items of safety concern listed above.  Mechel Schutter, [place] CarboKristin L 10/16/2015, 11:29 AM

## 2015-10-16 NOTE — BHH Group Notes (Addendum)
BHH Group Notes:  (Nursing/MHT/Case Management/Adjunct)  Date:  10/16/2015  Time:  0900  Type of Therapy:  Nurse Education - SMART Method for Goal Setting to Promote Recovery  Participation Level:  Active     Participation Quality:  Appropriate  Affect:  Sleepy   Cognitive:  Appropriate  Insight:  Good  Engagement in Group: Good    Modes of Intervention:  Education, Discussion  Summary of Progress/Problems: Patient attended nurse ed group and shared her goal for today is to avoid naps during the day. Goal 3-6 months from now is to decide on and enroll in alternate college/university as she is withdrawing from [employer].  Merian CapronFriedman, Aram Domzalski [facility] 10/16/2015, 0930

## 2015-10-16 NOTE — BHH Group Notes (Signed)
B[facility] LCSW Group Therapy  10/16/2015   1:15 PM   Type of Therapy:  Group Therapy  Participation Level:  Minimal  Participation Quality:  Attentive, sharing and supportive  Affect:  Depressed and Flat  Cognitive:  Alert and Oriented  Insight:  Developing/Improving and Engaged  Engagement in Therapy:  Developing/Improving and Engaged  Modes of Intervention:  Clarification, Confrontation, Discussion, Education, Exploration, Limit-setting, Orientation, Problem-solving, Rapport Building, Dance movement psychotherapisteality Testing, Socialization and Support  Summary of Progress/Problems: The topic for group therapy was feelings about diagnosis.  Pt actively participated in group discussion on their past and current diagnosis and how they feel towards this.  Pt also identified how society and family members judge them, based on their diagnosis as well as stereotypes and stigmas.  Ct. was initially sharing and responding to the CSW's and other group member's prompts with one and two-word responses.  As the session progressed ct. was increasingly involved in group discussion even presenting as calm and mellow towards the end of the group session.  Ct. discussed ct.'s realization that those in the ct.'s life, that the ct. felt were initially supportive of the ct. were not, in actuality, "there for the ct. and ct. connects this loss of friends with progress in regards to the ct.'s eventual success with the ct.'s treatment of the ct.'s depression.  Samuella BruinKristin Judaea Burgoon, MSW, [employer]CS[employer] Clinical Social Worker [facility] [phone]

## 2015-10-17 DIAGNOSIS — R45851 Suicidal ideations: Secondary | ICD-10-CM | POA: Insufficient documentation

## 2015-10-17 LAB — HEPATITIS C ANTIBODY: HCV Ab: <0.1 s/co ratio (ref 0.0–0.9)

## 2015-10-17 LAB — HEPATITIS B SURFACE ANTIGEN: Hepatitis B Surface Ag: NEGATIVE

## 2015-10-17 LAB — HEMOGLOBIN A1C
Hgb A1c MFr Bld: 5.8 % — ABNORMAL HIGH (ref 4.8–5.6)
Mean Plasma Glucose: 120 mg/dL

## 2015-10-17 NOTE — Progress Notes (Signed)
  [facility] :  Will you be returning to the same living situation after discharge:  Yes,  patient plans to return home to live with parents afte discharge. At discharge, do you have transportation home?: Yes,  parent will pick up. Do you have the ability to pay for your medications: Yes,  patient will be provided with prescriptions at discharge.  Release of information consent forms completed and in the chart;  Patient's signature needed at discharge.  Patient to Follow up at: Follow-up Information    Follow up with Dr. [name].   Why:  Therapy appt with Dr. Felipa FurnaceGarcia on Monday Nov. 14 at 1pm. Call office if you need to reschedule.    Contact information:   [address],  [place], [place] [address] Phone: [phone]      Follow up with [facility].   Why:  Referral for medication management services faxed on 11/9. Office staff should call you within 3-4 business days to schedule appointment. If you do not hear from office by Tuesday Nov. 15th, please call office to schedule.     Contact information:   [address] [place] [phone]      Next level of care provider has access to [facility] Link:no  Patient denies SI/HI: Yes,  patient denies.     Safety Planning and Suicide Prevention discussed: Yes,  with patient and mother.  Have you used any form of tobacco in the last 30 days? (Cigarettes, Smokeless Tobacco, Cigars, and/or Pipes): Yes  Has patient been referred to the Quitline?: Patient refused referral  Sokha Craker, [place]ristin L 10/17/2015, 11:28 AM
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
## 2015-10-17 NOTE — [facility] Group Notes (Signed)
   [facility]  10/17/2015  8:45 AM   Participation Quality: Alert, Appropriate and Oriented  Mood/Affect: Guarded and withdrawn  Depression Rating: 2  Anxiety Rating: 2  Thoughts of Suicide: Pt denies SI/HI  Will you contract for safety? Yes  Current AVH: Pt denies  Plan for Discharge/Comments: Pt attended discharge planning group and actively participated in group. CSW provided pt with today's workbook. Client stated need for a letter, that is required for the ct to return to school.  Ct reported that the ct is ready to be discharged and will return home without outpatient follow-up.  Transportation Means: Pt reports access to transportation  Supports: No supports mentioned at this time  Samuella BruinKristin Sean Malinowski, MSW, [employer]CSWA Clinical Social Worker [facility] [phone]

## 2015-10-17 NOTE — Progress Notes (Signed)
Recreation Therapy Notes  Date: 10/17/15 Time: 930 Location: 300 [address] Dayroom  Group Topic: Stress Management  Goal Area(s) Addresses:  Patient will verbalize importance of using healthy stress management.  Patient will identify positive emotions associated with healthy stress management.   Intervention: Stress Management  Activity :  Progressive Muscle Relaxation.  LRT introduced and educated patients on stress management technique of progressive muscle relaxation.  A script was used to deliver the technique to patients and patients were asked to follow script read allowed by LRT to engage in practicing the stress management technique.    Education:  Stress Management, Discharge Planning.   Education Outcome: Acknowledges edcuation/In group clarification offered/Needs additional education  Clinical Observations/Feedback:  Patient did not attend group.    Barbarajean Kinzler, LRT/CTRS         Chrystine Frogge A 10/17/2015 4:04 PM 
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
## 2015-10-17 NOTE — Progress Notes (Signed)
Discharge note: Pt received both written and verbal discharge instructions. Pt verbalized understanding of discharge instructions. Pt agreed to f/u and med regimen. Pt received belongings from room and locker. Pt received prescriptions and home med supply of meds from med room. Pt also given eye contacts (home supply) that were stored in med room. Pt safely left [facility] with her father.

## 2015-10-17 NOTE — Progress Notes (Signed)
D; Pt was pleasant and cooperative, however still flat and depressed.  Pt has other questions or concerns.   A:  Support and encouragement was offered. 15 min checks continued for safety.  R: Pt remains safe.

## 2015-12-17 ENCOUNTER — Other Ambulatory Visit: Payer: Self-pay | Admitting: Nurse Practitioner

## 2015-12-17 NOTE — Telephone Encounter (Signed)
Medication refill request: viorele Last AEX: 07-23-15 Next AEX: 07-25-16 Last MMG (if hormonal medication request): n/a Refill authorized: rx was last given 12-05-14. aex note from 07-23-15 stated rx for [id number] but no rx was sent at that time. Please send rx to pharmacy

## 2016-01-09 DIAGNOSIS — F319 Bipolar disorder, unspecified: Secondary | ICD-10-CM

## 2016-01-09 HISTORY — DX: Bipolar disorder, unspecified: F31.9

## 2016-04-07 ENCOUNTER — Emergency Department (HOSPITAL_COMMUNITY): Admission: EM | Admit: 2016-04-07 | Discharge: 2016-04-07 | Discharge status: Against Medical Advice | Payer: 59

## 2016-05-08 DIAGNOSIS — F4310 Post-traumatic stress disorder, unspecified: Secondary | ICD-10-CM

## 2016-05-08 DIAGNOSIS — F1910 Other psychoactive substance abuse, uncomplicated: Secondary | ICD-10-CM

## 2016-05-08 HISTORY — DX: Other psychoactive substance abuse, uncomplicated: F19.10

## 2016-05-08 HISTORY — DX: Post-traumatic stress disorder, unspecified: F43.10

## 2016-06-09 ENCOUNTER — Other Ambulatory Visit: Payer: Self-pay | Admitting: Nurse Practitioner

## 2016-06-09 NOTE — Telephone Encounter (Signed)
Medication refill request: Viorelle Last AEX:  07/23/15 PG Next AEX: 07/25/16 Last MMG (if hormonal medication request): N/A Refill authorized: 12/17/15 #[id number]. Please advise. Thank you.

## 2016-07-25 ENCOUNTER — Encounter: Payer: Self-pay | Admitting: Nurse Practitioner

## 2016-07-25 ENCOUNTER — Ambulatory Visit (INDEPENDENT_AMBULATORY_CARE_PROVIDER_SITE_OTHER): Payer: 59 | Admitting: Nurse Practitioner

## 2016-07-25 VITALS — BP 100/60 | HR 78 | Resp 16 | Ht 65.0 in | Wt 113.6 lb

## 2016-07-25 DIAGNOSIS — Z0000 Encounter for general adult medical examination without abnormal findings: Secondary | ICD-10-CM | POA: Diagnosis not present

## 2016-07-25 DIAGNOSIS — Z113 Encounter for screening for infections with a predominantly sexual mode of transmission: Secondary | ICD-10-CM

## 2016-07-25 DIAGNOSIS — Z01419 Encounter for gynecological examination (general) (routine) without abnormal findings: Secondary | ICD-10-CM

## 2016-07-25 DIAGNOSIS — Z309 Encounter for contraceptive management, unspecified: Secondary | ICD-10-CM

## 2016-07-25 DIAGNOSIS — Z3009 Encounter for other general counseling and advice on contraception: Secondary | ICD-10-CM

## 2016-07-25 NOTE — Progress Notes (Signed)
20 y.o. G0P0 Single  Caucasian Fe here for annual exam.  Normally menses last 3-9 days, last cycle was 6 days. Cramps are sometimes the entire cycle.  She does not use OTC NSAID's because her mother does not want her to.   SA with same partner X 2 months.  Condoms most times.  Just diagnosed with bipolar depression. Now stable on med's.  [facility] @ [facility] stay on November 16 @  & February 2017 in a hospital in [place], [place].  She denies any current feelings that puts her at danger at this time.  She leaves tomorrow for school in [place].  She would like to get the Paraguard IUD - she does not want any hormonal intervention for birth control as she is advised that may flare her Bipolar.  Since being off OCP she has fairly bad acne.  She has not been to dermatologist yet.  Patient's last menstrual period was 06/26/2016.          Sexually active: Yes.    The current method of family planning is condoms most of the time.    Exercising: No.  The patient does not participate in regular exercise at present. Smoker:  Yes 3 cigarettes a day  Health Maintenance: Pap:  n/a TDaP:  01/2016 HIV: 12/05/2014 Neg Labs: None today.    reports that she has been smoking Cigarettes.  She has never used smokeless tobacco. She reports that she drinks about 1.8 oz of alcohol per week . She reports that she uses drugs, including Marijuana.  Past Medical History:  Diagnosis Date  . Irregular menstrual cycle 06/19/2014  . 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 MOUTH SURGERY  4 years ago     Current Outpatient Prescriptions  Medication Sig Dispense Refill  . buPROPion (WELLBUTRIN) 75 MG tablet     . lamoTRIgine (LAMICTAL) 25 MG tablet     . prazosin (MINIPRESS) 1 MG capsule TAKE 1 CAPSULE BY ORAL ROUTE 1 TIME PER DAY  3  . traZODone (DESYREL) 50 MG tablet Take 1 tablet (50 mg total) by mouth at bedtime as needed for sleep. 30 tablet 0   No  current facility-administered medications for this visit.     Family History  Problem Relation Age of Onset  . Prostate cancer Father   . Diabetes Mellitus II Paternal Grandmother   . Diabetes Mellitus II Paternal Grandfather   . Diabetes Mellitus II Father   . Depression Paternal Grandmother   . Fibroids Maternal Grandmother   . Hypertension Paternal Grandmother   . Hypertension Paternal Grandfather   . Hypertension Father   . Osteoporosis Maternal Grandmother     ROS:  Pertinent items are noted in HPI.  Otherwise, a comprehensive ROS was negative.  Exam:   BP 100/60 (BP Location: Right Arm, Patient Position: Sitting, Cuff Size: Small)   Pulse 78   Resp 16   Ht 5\' 5"  (1.651 m)   Wt 113 lb 9.6 oz (51.5 kg)   LMP 06/26/2016   BMI 18.90 kg/m  Height: 5\' 5"  (165.1 cm) Ht Readings from Last 3 Encounters:  07/25/16 5\' 5"  (1.651 m)  10/07/15 5\' 4"  (1.626 m) (46 %, Z= -0.11)*  07/23/15 5' 6.5" (1.689 m) (81 %, Z= 0.87)*   * Growth percentiles are based on CDC 2-20 Years data.    General appearance: alert, cooperative and appears stated age Head: Normocephalic, without obvious abnormality, atraumatic  Neck: no adenopathy, supple, symmetrical, trachea midline and thyroid normal to inspection and palpation Lungs: clear to auscultation bilaterally Breasts: normal appearance, no masses or tenderness Heart: regular rate and rhythm Abdomen: soft, non-tender; no masses,  no organomegaly Extremities: extremities normal, atraumatic, no cyanosis or edema Skin: Skin color, texture, turgor normal. No rashes or lesions Lymph nodes: Cervical, supraclavicular, and axillary nodes normal. No abnormal inguinal nodes palpated Neurologic: Grossly normal   Pelvic: External genitalia:  no lesions              Urethra:  normal appearing urethra with no masses, tenderness or lesions              Bartholin's and Skene's: normal                 Vagina: normal appearing vagina with normal color and  discharge, no lesions              Cervix: anteverted              Pap taken: No. Bimanual Exam:  Uterus:  normal size, contour, position, consistency, mobility, non-tender              Adnexa: no mass, fullness, tenderness               Rectovaginal: Confirms               Anus:  normal sphincter tone, no lesions  Chaperone present: yes  A:  Well Woman with normal exam  Contraception with condoms prn              Iron deficiency anemia  New diagnosis of Bipolar Depression  Desires Paraguard IUD for birth control  Acne flare     P:   Reviewed health and wellness pertinent to exam  Pap smear not indicated secondary to age  Did do a GC & Chl today  Information is given about Paraguard IUD along with insurance codes  She plans on calling with her cycle and agreed that she could do consult and IUD the same day due to school schedule.  She can have Cytotec if she decides to go this direction.  Counseled on breast self exam, STD prevention, HIV risk factors and prevention, adequate intake of calcium and vitamin D, diet and exercise return annually or prn  An After Visit Summary was printed and given to the patient.
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
## 2016-07-25 NOTE — Progress Notes (Signed)
Reviewed personally.  M. Suzanne Cordera Stineman, MD.  

## 2016-07-26 ENCOUNTER — Other Ambulatory Visit: Payer: Self-pay | Admitting: Nurse Practitioner

## 2016-07-26 LAB — WET PREP BY MOLECULAR PROBE
Candida species: NEGATIVE
Gardnerella vaginalis: POSITIVE — AB
Trichomonas vaginosis: NEGATIVE

## 2016-07-26 MED ORDER — METRONIDAZOLE 0.75 % VA GEL: 1.0000 | Freq: Every day | VAGINAL | 0 refills | Status: DC

## 2016-07-29 LAB — IPS N GONORRHOEA AND CHLAMYDIA BY PCR

## 2017-02-14 ENCOUNTER — Inpatient Hospital Stay (HOSPITAL_COMMUNITY)
Admission: AD | Admit: 2017-02-14 | Discharge: 2017-02-19 | DRG: 882 | Disposition: A | Discharge status: Home / Self Care | Payer: 59 | Attending: Psychiatry | Admitting: Psychiatry

## 2017-02-14 ENCOUNTER — Encounter (HOSPITAL_COMMUNITY): Payer: Self-pay | Admitting: *Deleted

## 2017-02-14 DIAGNOSIS — F319 Bipolar disorder, unspecified: Secondary | ICD-10-CM | POA: Diagnosis present

## 2017-02-14 DIAGNOSIS — Z630 Problems in relationship with spouse or partner: Secondary | ICD-10-CM

## 2017-02-14 DIAGNOSIS — Z79899 Other long term (current) drug therapy: Secondary | ICD-10-CM

## 2017-02-14 DIAGNOSIS — F1290 Cannabis use, unspecified, uncomplicated: Secondary | ICD-10-CM | POA: Diagnosis not present

## 2017-02-14 DIAGNOSIS — Z818 Family history of other mental and behavioral disorders: Secondary | ICD-10-CM

## 2017-02-14 DIAGNOSIS — R45851 Suicidal ideations: Secondary | ICD-10-CM | POA: Diagnosis present

## 2017-02-14 DIAGNOSIS — F314 Bipolar disorder, current episode depressed, severe, without psychotic features: Secondary | ICD-10-CM | POA: Diagnosis present

## 2017-02-14 DIAGNOSIS — N926 Irregular menstruation, unspecified: Secondary | ICD-10-CM | POA: Diagnosis present

## 2017-02-14 DIAGNOSIS — F4322 Adjustment disorder with anxiety: Secondary | ICD-10-CM | POA: Diagnosis present

## 2017-02-14 DIAGNOSIS — Z9114 Patient's other noncompliance with medication regimen: Secondary | ICD-10-CM

## 2017-02-14 DIAGNOSIS — Z915 Personal history of self-harm: Secondary | ICD-10-CM

## 2017-02-14 DIAGNOSIS — F603 Borderline personality disorder: Secondary | ICD-10-CM

## 2017-02-14 DIAGNOSIS — F1994 Other psychoactive substance use, unspecified with psychoactive substance-induced mood disorder: Secondary | ICD-10-CM | POA: Diagnosis present

## 2017-02-14 DIAGNOSIS — Z880 Allergy status to penicillin: Secondary | ICD-10-CM

## 2017-02-14 DIAGNOSIS — F17210 Nicotine dependence, cigarettes, uncomplicated: Secondary | ICD-10-CM | POA: Diagnosis present

## 2017-02-14 DIAGNOSIS — Z7289 Other problems related to lifestyle: Secondary | ICD-10-CM

## 2017-02-14 DIAGNOSIS — F419 Anxiety disorder, unspecified: Secondary | ICD-10-CM | POA: Diagnosis not present

## 2017-02-14 DIAGNOSIS — F4310 Post-traumatic stress disorder, unspecified: Secondary | ICD-10-CM | POA: Diagnosis present

## 2017-02-14 DIAGNOSIS — G4700 Insomnia, unspecified: Secondary | ICD-10-CM | POA: Diagnosis not present

## 2017-02-14 DIAGNOSIS — F1220 Cannabis dependence, uncomplicated: Secondary | ICD-10-CM | POA: Diagnosis present

## 2017-02-14 MED ORDER — TRAZODONE HCL 50 MG PO TABS
50.0000 mg | ORAL_TABLET | Freq: Every evening | ORAL | Status: DC | PRN
  Administered 2017-02-14: 50 mg via ORAL

## 2017-02-14 MED ORDER — ALUM & MAG HYDROXIDE-SIMETH 200-200-20 MG/5ML PO SUSP: 30.0000 mL | ORAL | Status: DC | PRN

## 2017-02-14 MED ORDER — ACETAMINOPHEN 325 MG PO TABS: 650.0000 mg | ORAL_TABLET | Freq: Four times a day (QID) | ORAL | Status: DC | PRN

## 2017-02-14 MED ORDER — MAGNESIUM HYDROXIDE 400 MG/5ML PO SUSP: 30.0000 mL | Freq: Every day | ORAL | Status: DC | PRN

## 2017-02-14 MED ORDER — NICOTINE POLACRILEX 2 MG MT GUM
2.0000 mg | CHEWING_GUM | OROMUCOSAL | Status: DC | PRN
  Administered 2017-02-15 – 2017-02-19 (×13): 2 mg via ORAL
  Filled 2017-02-14 (×3): qty 1

## 2017-02-14 MED ORDER — HYDROXYZINE HCL 25 MG PO TABS
25.0000 mg | ORAL_TABLET | Freq: Three times a day (TID) | ORAL | Status: DC | PRN
  Filled 2017-02-14: qty 1

## 2017-02-14 MED ORDER — ENSURE ENLIVE PO LIQD: 237.0000 mL | Freq: Two times a day (BID) | ORAL | Status: DC
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
## 2017-02-14 NOTE — H&P (Signed)
Behavioral Health Medical Screening Exam  [name] [name] is an 21 y.o. female.  Total Time spent with patient: 15 minutes  Psychiatric Specialty Exam: Physical Exam  Constitutional: She is oriented to person, place, and time. She appears well-developed and well-nourished. No distress.  HENT:  Head: Normocephalic and atraumatic.  Right Ear: External ear normal.  Left Ear: External ear normal.  Eyes: Pupils are equal, round, and reactive to light. Right eye exhibits no discharge. Left eye exhibits no discharge. No scleral icterus.  Neck: Normal range of motion.  Cardiovascular: Normal rate and normal heart sounds.   Respiratory: Effort normal and breath sounds normal. No respiratory distress.  Musculoskeletal: Normal range of motion.  Neurological: She is alert and oriented to person, place, and time.  Skin: Skin is warm and dry. She is not diaphoretic.  Psychiatric: Her speech is normal. Her mood appears anxious. Her affect is blunt. Her affect is not angry, not labile and not inappropriate. She is withdrawn. She is not actively hallucinating. Thought content is not paranoid and not delusional. Cognition and memory are normal. She expresses impulsivity and inappropriate judgment. She exhibits a depressed mood. She expresses suicidal ideation. She expresses no homicidal ideation. She expresses suicidal plans. She expresses no homicidal plans. She is attentive.    Review of Systems  Psychiatric/Behavioral: Positive for depression, substance abuse and suicidal ideas. Negative for hallucinations and memory loss. The patient is nervous/anxious and has insomnia.   All other systems reviewed and are negative.   Blood pressure (!) 104/56, pulse 76, temperature 98.8 F (37.1 C), resp. rate 18, SpO2 100 %.There is no height or weight on file to calculate BMI.  General Appearance: Casual  Eye Contact:  Fair  Speech:  Clear and Coherent and Normal Rate  Volume:  Decreased  Mood:  Anxious,  Depressed, Hopeless, Irritable and Worthless  Affect:  Blunt  Thought Process:  Coherent  Orientation:  Full (Time, Place, and Person)  Thought Content:  Logical and Hallucinations: None  Suicidal Thoughts:  Yes.  with intent/plan  Homicidal Thoughts:  Yes.  without intent/plan  Memory:  Immediate;   Good Recent;   Good Remote;   Good  Judgement:  Impaired  Insight:  Fair  Psychomotor Activity:  Decreased  Concentration: Concentration: Fair and Attention Span: Fair  Recall:  [employer]Fair  Fund of Knowledge:Fair  Language: Good  Akathisia:  No  Handed:  Right  AIMS (if indicated):     Assets:  Communication Skills Desire for Improvement Financial Resources/Insurance Housing Leisure Time Physical Health  Sleep:       Musculoskeletal: Strength & Muscle Tone: within normal limits Gait & Station: normal   Blood pressure (!) 104/56, pulse 76, temperature 98.8 F (37.1 C), resp. rate 18, SpO2 100 %.  Recommendations:  Based on my evaluation the patient does not appear to have an emergency medical condition.  Jackelyn PolingJason A Jensine Luz, NP 02/14/2017, 9:25 PM
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
## 2017-02-14 NOTE — Tx Team (Signed)
Initial Treatment Plan 02/14/2017 9:57 PM Charlott Rakesachel C Hinnenkamp EAV:[id number]    PATIENT STRESSORS: Marital or family conflict Occupational concerns Substance abuse Traumatic event   PATIENT STRENGTHS: Average or above average intelligence Capable of independent living Supportive family/friends Work skills   PATIENT IDENTIFIED PROBLEMS: Suicidal ideation  Depression  "not wanting to be alive to worry about anything"  (Pt wants to feel better)  "decrease drug use"               DISCHARGE CRITERIA:  Improved stabilization in mood, thinking, and/or behavior Motivation to continue treatment in a less acute level of care Need for constant or close observation no longer present Verbal commitment to aftercare and medication compliance  PRELIMINARY DISCHARGE PLAN: Outpatient therapy Return to previous work or school arrangements  PATIENT/FAMILY INVOLVEMENT: This treatment plan has been presented to and reviewed with the patient, Charlott RakesRachel C Forbess.  The patient and family have been given the opportunity to ask questions and make suggestions.  Juliann ParesBowman, Deronda Christian Elizabeth, RN 02/14/2017, 9:57 PM

## 2017-02-15 DIAGNOSIS — F4310 Post-traumatic stress disorder, unspecified: Secondary | ICD-10-CM | POA: Diagnosis present

## 2017-02-15 DIAGNOSIS — F1220 Cannabis dependence, uncomplicated: Secondary | ICD-10-CM | POA: Diagnosis present

## 2017-02-15 LAB — COMPREHENSIVE METABOLIC PANEL
ALT: 12 U/L — ABNORMAL LOW (ref 14–54)
AST: 14 U/L — ABNORMAL LOW (ref 15–41)
Albumin: 4.1 g/dL (ref 3.5–5.0)
Alkaline Phosphatase: 63 U/L (ref 38–126)
Anion gap: 5 (ref 5–15)
BUN: 19 mg/dL (ref 6–20)
CO2: 25 mmol/L (ref 22–32)
Calcium: 9.2 mg/dL (ref 8.9–10.3)
Chloride: 109 mmol/L (ref 101–111)
Creatinine, Ser: 0.77 mg/dL (ref 0.44–1.00)
GFR calc Af Amer: >60 mL/min (ref >60)
GFR calc non Af Amer: >60 mL/min (ref >60)
Glucose, Bld: 83 mg/dL (ref 65–99)
Potassium: 3.6 mmol/L (ref 3.5–5.1)
Sodium: 139 mmol/L (ref 135–145)
Total Bilirubin: 0.7 mg/dL (ref 0.3–1.2)
Total Protein: 7.2 g/dL (ref 6.5–8.1)

## 2017-02-15 LAB — LIPID PANEL
Cholesterol: 118 mg/dL (ref 0–200)
HDL: 45 mg/dL (ref >40)
LDL Cholesterol: 58 mg/dL (ref 0–99)
Total CHOL/HDL Ratio: 2.6 RATIO
Triglycerides: 74 mg/dL (ref <150)
VLDL: 15 mg/dL (ref 0–40)

## 2017-02-15 LAB — CBC
HCT: 36.3 % (ref 36.0–46.0)
Hemoglobin: 11.5 g/dL — ABNORMAL LOW (ref 12.0–15.0)
MCH: 26 pg (ref 26.0–34.0)
MCHC: 31.7 g/dL (ref 30.0–36.0)
MCV: 81.9 fL (ref 78.0–100.0)
Platelets: 247 10*3/uL (ref 150–400)
RBC: 4.43 MIL/uL (ref 3.87–5.11)
RDW: 14.4 % (ref 11.5–15.5)
WBC: 7.6 10*3/uL (ref 4.0–10.5)

## 2017-02-15 LAB — TSH: TSH: 2.751 u[IU]/mL (ref 0.350–4.500)

## 2017-02-15 MED ORDER — MIRTAZAPINE 15 MG PO TABS
15.0000 mg | ORAL_TABLET | Freq: Every day | ORAL | Status: DC
  Administered 2017-02-15 – 2017-02-18 (×4): 15 mg via ORAL
  Filled 2017-02-15 (×6): qty 1
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
## 2017-02-15 NOTE — Progress Notes (Signed)
Patient ID: Charlott Rakesachel C Sharron, female   DOB: 12/11/1995, 21 y.o.   MRN: [id number]  DAR: Pt. Denies SI/HI and A/V Hallucinations. She reports sleep is good, appetite is poor, and energy level is low. She rates depression 5/10, hopelessness 2/10, and anxiety 0/10. Patient does report pain in her head but refuses intervention at this time. Support and encouragement provided to the patient. Patient refuses her scheduled Ensure. Patient is labile in her mood throughout the day. Initially, patient is guarded, irritable, and minimal with Clinical research associatewriter. As the day progresses she is hyperactive, wrapped in a blanket walking around the halls. She is childlike in behavior when she received PRN gum from writer this afternoon. On reassessment she is sitting calmly in the dayroom looking at reading material. During the afternoon nursing group she appeared inattentive. Writer has again requested patient for urine sample however the cup remains sitting on patient's bed without a sample provided. Q15 minute checks are maintained for safety.

## 2017-02-15 NOTE — Progress Notes (Signed)
Patient ID: [name] [name], female   DOB: 11/26/1996, 21 y.o.   MRN: [id number]  EKG done and placed on patient's physical chart. No urine specimen was provided by patient on this shift.

## 2017-02-15 NOTE — Progress Notes (Signed)
Patient ID: [name] [name], female   DOB: 04/05/1996, 21 y.o.   MRN: [id number]  Writer requested that patient provide urine specimen in provided sterile cup. Patient has yet to do so.

## 2017-02-15 NOTE — Progress Notes (Signed)
Patient attended AA group meeting.  

## 2017-02-15 NOTE — [facility] Group Notes (Signed)
[facility] Group Notes:  (Clinical Social Work)  02/15/2017  10:00-11:00AM  Summary of Progress/Problems:   The main focus of today's process group was to   1)  discuss the importance of adding supports  2)  contrast healthy and unhealthy supports  3)  identify the patient's current level of healthy support and   4)  elicit consideration to add healthy supports  An emphasis was placed on using counselor, doctor, therapy groups, 12-step groups, and problem-specific support groups to expand supports.     The patient expressed comprehension of the concepts presented of adding more healthy supports and deciding what to do to minimize healthy supports.  The patient stated her parents and a best friend are her main supports, but she fears her parents being "overly dramatic" and that is why she does not tell them what is going on with her mental health. She is afraid they would force her to quit school and come home, and she feels that is not what she needs.  Type of Therapy:  Process Group  Participation Level:  Active  Participation Quality:  Attentive, Sharing and Supportive  Affect:  Blunted  Cognitive:  Oriented  Insight:  Developing/Improving  Engagement in Therapy:  Engaged  Modes of Intervention:   Education, Support and Processing, Activity  Ambrose MantleMareida Grossman-Orr, LCSW 02/15/2017  11:17 AM

## 2017-02-15 NOTE — [facility] Group Notes (Signed)
[facility] Group Notes:  (Nursing/MHT/Case Management/Adjunct)  Date:  02/15/2017  Time:  4:06 PM  Type of Therapy:  Nurse Education  Participation Level:  Minimal  Participation Quality:  Inattentive  Affect:  Flat  Cognitive:  Confused and Lacking  Insight:  Lacking  Engagement in Group:  Poor  Modes of Intervention:  Discussion and Education  Summary of Progress/Problems: "different perspectives" group including education and activity.  [name] BergamoBarbara M [name] 02/15/2017, 4:06 PM
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
## 2017-02-15 NOTE — Progress Notes (Signed)
D.  Pt pleasant but cautious on approach, denies complaints at this time.  Pt was positive for evening AA group, minimal but appropriate interaction on unit.  Pt denies HI/hallucinations at this time but does report continued passive SI.  She does contract for safety on the unit.  A.  Support and encouragement offered, medication given as ordered.  R.  Pt remains safe on the unit, will continue to monitor.

## 2017-02-16 DIAGNOSIS — Z79899 Other long term (current) drug therapy: Secondary | ICD-10-CM

## 2017-02-16 DIAGNOSIS — F1220 Cannabis dependence, uncomplicated: Secondary | ICD-10-CM

## 2017-02-16 DIAGNOSIS — G4700 Insomnia, unspecified: Secondary | ICD-10-CM

## 2017-02-16 DIAGNOSIS — F17210 Nicotine dependence, cigarettes, uncomplicated: Secondary | ICD-10-CM

## 2017-02-16 DIAGNOSIS — F4310 Post-traumatic stress disorder, unspecified: Principal | ICD-10-CM

## 2017-02-16 DIAGNOSIS — F419 Anxiety disorder, unspecified: Secondary | ICD-10-CM

## 2017-02-16 DIAGNOSIS — F314 Bipolar disorder, current episode depressed, severe, without psychotic features: Secondary | ICD-10-CM

## 2017-02-16 DIAGNOSIS — Z818 Family history of other mental and behavioral disorders: Secondary | ICD-10-CM

## 2017-02-16 LAB — RAPID URINE DRUG SCREEN, HOSP PERFORMED
Amphetamines: NOT DETECTED
Barbiturates: NOT DETECTED
Benzodiazepines: NOT DETECTED
Cocaine: NOT DETECTED
Opiates: NOT DETECTED
Tetrahydrocannabinol: POSITIVE — AB

## 2017-02-16 LAB — HEMOGLOBIN A1C
Hgb A1c MFr Bld: 5.5 % (ref 4.8–5.6)
Mean Plasma Glucose: 111 mg/dL

## 2017-02-16 LAB — PREGNANCY, URINE: Preg Test, Ur: NEGATIVE
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
## 2017-02-16 NOTE — [facility] Group Notes (Signed)
[facility] LCSW Group Therapy  02/16/2017 1:36 PM  Type of Therapy:  Group Therapy  Participation Level:  Active  Participation Quality:  Attentive  Affect:  Appropriate  Cognitive:  Alert and Oriented  Insight:  Improving  Engagement in Therapy:  Improving  Modes of Intervention:  Confrontation, Discussion, Education, Problem-solving, Socialization and Support  Summary of Progress/Problems: Today's Topic: Overcoming Obstacles. Patients identified one short term goal and potential obstacles in reaching this goal. Patients processed barriers involved in overcoming these obstacles. Patients identified steps necessary for overcoming these obstacles and explored motivation (internal and external) for facing these difficulties head on. [name] [name] was attentive and engaged during today's processing group. She shared that she plans to return to school "and just needed time away from my stressful life to calm down and reflect." [name] [name] is hoping to connect with a specific therapist at school but does not want records sent. Pt is open to a psychiatrist referral "in the [place] region." [name] [name] continues to show progress in the group setting with improving insight.   [name] N Smart LCSW 02/16/2017, 1:36 PM

## 2017-02-16 NOTE — Psychosocial Assessment (Signed)
Patient attended wrap up group and said that her day was a 10.  Something positive that occurred during the day was she did all her school assignment.

## 2017-02-16 NOTE — Progress Notes (Signed)
Patient ID: [name] [name], female   DOB: 10/10/1996, 21 y.o.   MRN: [id number]  DAR: Pt. Denies SI/HI and A/V Hallucinations. She reports sleep is fair, appetite is fair, energy level is normal, and concentration is good. She rates depression 2/10, hopelessness 2/10, and anxiety 4/10. Support and encouragement provided to the patient. Patient appears less guarded and irritable today as compared to writer's assessment yesterday. She is seen more in the milieu and is attending groups. She returned her suicide safety plan and it was filled out. She remains minimal with Clinical research associatewriter but cooperative. Q15 minute checks are maintained for safety.
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
## 2017-02-16 NOTE — Progress Notes (Signed)
Nutrition Brief Note  Patient identified on the Malnutrition Screening Tool (MST) Report  Pt's weight has been steady. Pt reporting poor appetite but is refusing supplements.  Wt Readings from Last 15 Encounters:  02/14/17 110 lb (49.9 kg)  07/25/16 113 lb 9.6 oz (51.5 kg)  10/14/15 113 lb (51.3 kg) (22 %, Z= -0.78)*  10/07/15 115 lb (52.2 kg) (26 %, Z= -0.65)*  07/23/15 114 lb (51.7 kg) (24 %, Z= -0.69)*  03/08/15 114 lb 6.4 oz (51.9 kg) (27 %, Z= -0.62)*  12/05/14 123 lb (55.8 kg) (46 %, Z= -0.09)*  06/19/14 117 lb (53.1 kg) (36 %, Z= -0.37)*  12/20/12 113 lb 5 oz (51.4 kg) (35 %, Z= -0.37)*   * Growth percentiles are based on CDC 2-20 Years data.    Body mass index is 18.3 kg/m. Patient meets criteria for underweight based on current BMI.   Labs and medications reviewed.   No nutrition interventions warranted at this time. If nutrition issues arise, please consult RD.   Tilda FrancoLindsey Byron Peacock, MS, RD, LDN Pager: [phone] After Hours Pager: [phone]

## 2017-02-16 NOTE — Tx Team (Signed)
Interdisciplinary Treatment and Diagnostic Plan Update  02/16/2017 Time of Session: 0930 [name] RakesRachel C [name] MRN: [id number]  Principal Diagnosis: PTSD (post-traumatic stress disorder)  Secondary Diagnoses: Principal Problem:   PTSD (post-traumatic stress disorder) Active Problems:   Suicidal ideations   Cannabis use disorder, severe, dependence (HCC)   Current Medications:  Current Facility-Administered Medications  Medication Dose Route Frequency Provider Last Rate Last Dose  . acetaminophen (TYLENOL) tablet 650 mg  650 mg Oral Q6H PRN Jackelyn PolingJason A Berry, NP      . alum & mag hydroxide-simeth (MAALOX/MYLANTA) 200-200-20 MG/5ML suspension 30 mL  30 mL Oral Q4H PRN Jackelyn PolingJason A Berry, NP      . feeding supplement (ENSURE ENLIVE) (ENSURE ENLIVE) liquid 237 mL  237 mL Oral BID BM Rockey SituFernando A Cobos, MD      . hydrOXYzine (ATARAX/VISTARIL) tablet 25 mg  25 mg Oral TID PRN Jackelyn PolingJason A Berry, NP      . magnesium hydroxide (MILK OF MAGNESIA) suspension 30 mL  30 mL Oral Daily PRN Jackelyn PolingJason A Berry, NP      . mirtazapine (REMERON) tablet 15 mg  15 mg Oral QHS Burnard LeighAlexander Arya Eksir, MD   15 mg at 02/15/17 2159  . nicotine polacrilex (NICORETTE) gum 2 mg  2 mg Oral PRN Craige CottaFernando A Cobos, MD   2 mg at 02/15/17 1826   PTA Medications: Prescriptions Prior to Admission  Medication Sig Dispense Refill Last Dose  . buPROPion (WELLBUTRIN) 75 MG tablet    Taking  . lamoTRIgine (LAMICTAL) 25 MG tablet    Taking  . metroNIDAZOLE (METROGEL) 0.75 % vaginal gel Place 1 Applicatorful vaginally at bedtime. 70 g 0   . prazosin (MINIPRESS) 1 MG capsule TAKE 1 CAPSULE BY ORAL ROUTE 1 TIME PER DAY  3 Taking  . traZODone (DESYREL) 50 MG tablet Take 1 tablet (50 mg total) by mouth at bedtime as needed for sleep. 30 tablet 0 Taking    Patient Stressors: Marital or family conflict Occupational concerns Substance abuse Traumatic event  Patient Strengths: Average or above average intelligence Capable of independent  living Supportive family/friends Work skills  Treatment Modalities: Medication Management, Group therapy, Case management,  1 to 1 session with clinician, Psychoeducation, Recreational therapy.   Physician Treatment Plan for Primary Diagnosis: PTSD (post-traumatic stress disorder) Long Term Goal(s): Improvement in symptoms so as ready for discharge Improvement in symptoms so as ready for discharge   Short Term Goals: Ability to identify changes in lifestyle to reduce recurrence of condition will improve Ability to verbalize feelings will improve Ability to disclose and discuss suicidal ideas Ability to demonstrate self-control will improve Ability to identify and develop effective coping behaviors will improve Ability to maintain clinical measurements within normal limits will improve Compliance with prescribed medications will improve Ability to identify triggers associated with substance abuse/mental health issues will improve Ability to identify changes in lifestyle to reduce recurrence of condition will improve Ability to verbalize feelings will improve Ability to disclose and discuss suicidal ideas Ability to demonstrate self-control will improve Ability to identify and develop effective coping behaviors will improve Ability to maintain clinical measurements within normal limits will improve Compliance with prescribed medications will improve Ability to identify triggers associated with substance abuse/mental health issues will improve  Medication Management: Evaluate patient's response, side effects, and tolerance of medication regimen.  Therapeutic Interventions: 1 to 1 sessions, Unit Group sessions and Medication administration.  Evaluation of Outcomes: Progressing  Physician Treatment Plan for Secondary Diagnosis: Principal Problem:  PTSD (post-traumatic stress disorder) Active Problems:   Suicidal ideations   Cannabis use disorder, severe, dependence (HCC)  Long  Term Goal(s): Improvement in symptoms so as ready for discharge Improvement in symptoms so as ready for discharge   Short Term Goals: Ability to identify changes in lifestyle to reduce recurrence of condition will improve Ability to verbalize feelings will improve Ability to disclose and discuss suicidal ideas Ability to demonstrate self-control will improve Ability to identify and develop effective coping behaviors will improve Ability to maintain clinical measurements within normal limits will improve Compliance with prescribed medications will improve Ability to identify triggers associated with substance abuse/mental health issues will improve Ability to identify changes in lifestyle to reduce recurrence of condition will improve Ability to verbalize feelings will improve Ability to disclose and discuss suicidal ideas Ability to demonstrate self-control will improve Ability to identify and develop effective coping behaviors will improve Ability to maintain clinical measurements within normal limits will improve Compliance with prescribed medications will improve Ability to identify triggers associated with substance abuse/mental health issues will improve     Medication Management: Evaluate patient's response, side effects, and tolerance of medication regimen.  Therapeutic Interventions: 1 to 1 sessions, Unit Group sessions and Medication administration.  Evaluation of Outcomes: Progressing   RN Treatment Plan for Primary Diagnosis: PTSD (post-traumatic stress disorder) Long Term Goal(s): Knowledge of disease and therapeutic regimen to maintain health will improve  Short Term Goals: Ability to remain free from injury will improve, Ability to verbalize feelings will improve and Ability to disclose and discuss suicidal ideas  Medication Management: RN will administer medications as ordered by provider, will assess and evaluate patient's response and provide education to patient for  prescribed medication. RN will report any adverse and/or side effects to prescribing provider.  Therapeutic Interventions: 1 on 1 counseling sessions, Psychoeducation, Medication administration, Evaluate responses to treatment, Monitor vital signs and CBGs as ordered, Perform/monitor CIWA, COWS, AIMS and Fall Risk screenings as ordered, Perform wound care treatments as ordered.  Evaluation of Outcomes: Progressing   LCSW Treatment Plan for Primary Diagnosis: PTSD (post-traumatic stress disorder) Long Term Goal(s): Safe transition to appropriate next level of care at discharge, Engage patient in therapeutic group addressing interpersonal concerns.  Short Term Goals: Engage patient in aftercare planning with referrals and resources, Facilitate patient progression through stages of change regarding substance use diagnoses and concerns and Identify triggers associated with mental health/substance abuse issues  Therapeutic Interventions: Assess for all discharge needs, 1 to 1 time with Social worker, Explore available resources and support systems, Assess for adequacy in community support network, Educate family and significant other(s) on suicide prevention, Complete Psychosocial Assessment, Interpersonal group therapy.  Evaluation of Outcomes: Progressing  Progress in Treatment: Attending groups: Yes. Participating in groups: Yes. Taking medication as prescribed: Yes. Toleration medication: Yes. Family/Significant other contact made: No, will contact:  family member if patient consents Patient understands diagnosis: Yes. Discussing patient identified problems/goals with staff: Yes. Medical problems stabilized or resolved: Yes. Denies suicidal/homicidal ideation: Yes. Issues/concerns per patient self-inventory: No. Other: n/a  New problem(s) identified: No, Describe:  n/a  New Short Term/Long Term Goal(s): detox; medication stabilization; development of comprehensive mental  wellness/sobriety plan.   Discharge Plan or Barriers: CSW assessing for appropriate referrals.   Reason for Continuation of Hospitalization: none  Estimated Length of Stay: 3-5 days   Attendees: Patient: 02/16/2017 8:29 AM  Physician: Dr. Vanetta Shawl RN 02/16/2017 8:29 AM  Nursing: Darral Dash RN 02/16/2017 8:29 AM  RN  Care Manager: Onnie Boer [facility] 02/16/2017 8:29 AM  Social Worker: Chartered loss adjuster, LCSW; Donnelly Stager LCSWA 02/16/2017 8:29 AM  Recreational Therapist: Juliann Pares 02/16/2017 8:29 AM  Other: Armandina Stammer NP; Hillery Jacks NP 02/16/2017 8:29 AM  Other:  02/16/2017 8:29 AM  Other: 02/16/2017 8:29 AM    Scribe for Treatment Team: Ledell Peoples Smart, LCSW 02/16/2017 8:29 AM

## 2017-02-16 NOTE — Progress Notes (Signed)
First approach this evening, pt was irritable and angry that she and her roommate were not notified of the dinner hour.  They were both in bed, but pt says she was not asleep.  She says there was not an announcement, not did anyone come to tell them that the patients were leaving to go to the cafeteria.  She says that she got a tray when staff returned, but she is upset that she was not able to go to the cafeteria and choose a meal for herself, so she did not eat much off the tray.  She says that is unacceptable and that the staff should be reprimanded.  Education officer, communityWriter apologized and told Clinical research associatewriter it would be passed to the supervisor.  Writer also informed pt that staff would make an extra effort to come to their room to notify of meal time.  Otherwise, pt has been pleasant and appropriate.  Pt denies SI/HI/AVH.  She makes her needs known to staff.  She denies any withdrawal symptoms.  Support and encouragement offered.  Discharge plans are in process.  Safety maintained with q15 minute checks.

## 2017-02-16 NOTE — Progress Notes (Signed)
Recreation Therapy Notes  Date: 02/16/17 Time: 0930 Location: [address] Dayroom  Group Topic: Stress Management  Goal Area(s) Addresses:  Patient will verbalize importance of using healthy stress management.  Patient will identify positive emotions associated with healthy stress management.   Behavioral Response: Engaged  Intervention: Stress Management  Activity :  Letting Go Meditation.  LRT introduced the stress management technique of meditation.  LRT played a meditation from the Calm app focused on letting go.  Patients were to follow along as meditation played to engage in the technique.  Education:  Stress Management, Discharge Planning.   Education Outcome: Acknowledges edcuation/In group clarification offered/Needs additional education  Clinical Observations/Feedback: Pt attended group.   Caroll RancherMarjette Willow Reczek, LRT/CTRS         Caroll RancherLindsay, Rishon Thilges A 02/16/2017 12:22 PM

## 2017-02-17 DIAGNOSIS — F1290 Cannabis use, unspecified, uncomplicated: Secondary | ICD-10-CM

## 2017-02-17 NOTE — Progress Notes (Signed)
D: Pt denies SI, HI, and AVH. She is guarded, minimally receptive to interaction, and forwards little. She appears somewhat sullen and flat. She's been present in the dayroom, on the phone, and watching TV. Pt told previous shift RN that she must discharge today but mentioned nothing to this Clinical research associatewriter. On her self inventory, she reported good sleep, good appetite, normal energy level, and good concentration. She rated her depression 0/10, feelings of hopelessness 0/10, and anxiety 2/10. She listed her goal as "discharge."   A: Meds given as ordered. Q15 safety checks maintained. Support/encouragement offered.  R: Pt remains free from harm and continues with treatment. Will continue to monitor for needs/safety.

## 2017-02-17 NOTE — Progress Notes (Signed)
Pt signed a 72-hour request for discharge today at 1314. Hard copy on front of chart.

## 2017-02-17 NOTE — Progress Notes (Signed)
Pt has been upset that she is not discharging today and has had multiple complaints related to her dissatisfaction. She called her father, who arrived in the lobby asking to speak with MD. [facility] that the pt would not be discharged today. [facility] said that dad understood the patient could not be signed out.

## 2017-02-17 NOTE — Plan of Care (Signed)
Problem: Activity: Goal: Sleeping patterns will improve Outcome: Progressing Pt reports "good" sleep. Recorded sleep time was 6.25 hours.

## 2017-02-17 NOTE — Progress Notes (Signed)
[facility] MD Progress Note  02/17/2017 12:05 PM [name] [name]  MRN:  [id number]  Subjective:  21 yo Caucasian female, single, lives with her roommate. Background history of Bipolar Disorder and PTSD. History of self mutilation and recent trauma. Archivist and works part time. Patient was brought in by her father. Patient called her family and reported mood swings. She reported crying spells, poor sleep, not eating at night. She reported thoughts of wrecking her car or stabbing herself. She reported thoughts of going after others with a stake knife. Patient has a history of THC use. She has been off psychotropic medications for over six months. She was started on Mirtazapine during this admission.  Seen today. Patient tells me that while at work at the [employer], she started having "an episode ,,,, " at the sight of knives. Says she had thoughts of using it on herself or others there. Patient says she took herself away from the situation and called her parent. Patient states that she tends to have these episodes at least one a month. She acknowledged mood swings and symptoms as above. Patient states that since starting Mirtazapine, she has been sleeping well at night. Says she is now eating well and she has regained normal energy. Patient says she has not had those thoughts of suicide so far. She has not had any thoughts of harming others. Says she is able to think clearly. No racing thoughts. No hallucination in any modality. No feelings of things being unreal. No passivity of will. No passivity of thought. No nightmares. No evidence of acute PTSD. Patient states that her grades are great. She denies any recent stressors. No access to guns. Patient reports history of self mutilation. She burns her thigh and trunk with hair styling equipment. She denies any past suicidal behavior  Nursing staff notes that she has not been interacting much. She is focused on being discharged soon. No behavioral issues.  She has been tolerating her medications well.   SW reports that her family is concerned about her.   Principal Problem: PTSD (post-traumatic stress disorder)                                  Substance Induced Mood Disorder Diagnosis:   Patient Active Problem List   Diagnosis Date Noted  . PTSD (post-traumatic stress disorder) [F43.10] 02/15/2017  . Cannabis use disorder, severe, dependence (HCC) [F12.20] 02/15/2017  . Suicidal ideations [R45.851]   . Mild major depression, single episode (HCC) [F32.0] 10/15/2015  . Adjustment disorder with anxious mood [F43.22] 10/14/2015  . Irregular menstrual cycle [N92.6] 06/19/2014   Total Time spent with patient: 25 minutes  Past Psychiatric History: PTSD, MDD, Adjustment disorder with anxious mood.  Past Medical History:  Past Medical History:  Diagnosis Date  . Bipolar 1 disorder, depressed (HCC) 01/2016  . Irregular menstrual cycle 06/19/2014  . 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 PTSD (post-traumatic stress disorder) 05/2016  . Substance abuse 05/08/2016   Went to Rehab on 05/08/16 for 2 months.     Past Surgical History:  Procedure Laterality Date  . MOUTH SURGERY  4 years ago    Family History:  Family History  Problem Relation Age of Onset  . Prostate cancer Father   . Diabetes Mellitus II Father   . Hypertension Father   . Diabetes Mellitus II Paternal Grandmother   . Depression Paternal Grandmother   .  Hypertension Paternal Grandmother   . Diabetes Mellitus II Paternal Grandfather   . Hypertension Paternal Grandfather   . Fibroids Maternal Grandmother   . Osteoporosis Maternal Grandmother    Family Psychiatric  History: See H&P  Social History:  History  Alcohol Use  . 1.8 oz/week  . 3 Standard drinks or equivalent per week     History  Drug Use  . Types: Marijuana    Comment: Clean for 2 months (went to rehab 05/08/16)    Social History   Social History  . Marital status: Single    Spouse  name: N/A  . Number of children: N/A  . Years of education: N/A   Social History Main Topics  . Smoking status: Current Every Day Smoker    Types: Cigarettes  . Smokeless tobacco: Never Used     Comment: 3 cigarettes a day  . Alcohol use 1.8 oz/week    3 Standard drinks or equivalent per week  . Drug use: Yes    Types: Marijuana     Comment: Clean for 2 months (went to rehab 05/08/16)  . Sexual activity: Yes    Partners: Male    Birth control/ protection: Pill, Condom   Other Topics Concern  . None   Social History Narrative  . None   Additional Social History:    Pain Medications: Pt denies use Prescriptions: Pt denies use Over the Counter: Pt denies use History of alcohol / drug use?: Yes Longest period of sobriety (when/how long): Unknown Negative Consequences of Use:  (Pt denies) Withdrawal Symptoms:  (Pt denies) Name of Substance 1: Marijuana 1 - Age of First Use: 17 1 - Amount (size/oz): 5-6 grams 1 - Frequency: Daily 1 - Duration: Three years 1 - Last Use / Amount: 02/13/17, 5 grams  Sleep: Good  Appetite:  Good  Current Medications: Current Facility-Administered Medications  Medication Dose Route Frequency Provider Last Rate Last Dose  . acetaminophen (TYLENOL) tablet 650 mg  650 mg Oral Q6H PRN Jackelyn Poling, NP      . alum & mag hydroxide-simeth (MAALOX/MYLANTA) 200-200-20 MG/5ML suspension 30 mL  30 mL Oral Q4H PRN Jackelyn Poling, NP      . feeding supplement (ENSURE ENLIVE) (ENSURE ENLIVE) liquid 237 mL  237 mL Oral BID BM Rockey Situ Cobos, MD      . hydrOXYzine (ATARAX/VISTARIL) tablet 25 mg  25 mg Oral TID PRN Jackelyn Poling, NP      . magnesium hydroxide (MILK OF MAGNESIA) suspension 30 mL  30 mL Oral Daily PRN Jackelyn Poling, NP      . mirtazapine (REMERON) tablet 15 mg  15 mg Oral QHS Burnard Leigh, MD   15 mg at 02/16/17 2229  . nicotine polacrilex (NICORETTE) gum 2 mg  2 mg Oral PRN Craige Cotta, MD   2 mg at 02/17/17 1035    Lab  Results:  Results for orders placed or performed during the hospital encounter of 02/14/17 (from the past 48 hour(s))  Urine rapid drug screen (hosp performed)not at [facility]     Status: Abnormal   Collection Time: 02/15/17  9:09 PM  Result Value Ref Range   Opiates NONE DETECTED NONE DETECTED   Cocaine NONE DETECTED NONE DETECTED   Benzodiazepines NONE DETECTED NONE DETECTED   Amphetamines NONE DETECTED NONE DETECTED   Tetrahydrocannabinol POSITIVE (A) NONE DETECTED   Barbiturates NONE DETECTED NONE DETECTED    Comment:  DRUG SCREEN FOR MEDICAL PURPOSES ONLY.  IF CONFIRMATION IS NEEDED FOR ANY PURPOSE, NOTIFY LAB WITHIN 5 DAYS.        LOWEST DETECTABLE LIMITS FOR URINE DRUG SCREEN Drug Class       Cutoff (ng/mL) Amphetamine      1000 Barbiturate      200 Benzodiazepine   200 Tricyclics       300 Opiates          300 Cocaine          300 THC              50 Performed at [facility], [address] [place], [place] [address]   Pregnancy, urine     Status: None   Collection Time: 02/15/17  9:09 PM  Result Value Ref Range   Preg Test, Ur NEGATIVE NEGATIVE    Comment:        THE SENSITIVITY OF THIS METHODOLOGY IS >20 mIU/mL. Performed at [facility], [address] [place], [place] [address]    Blood Alcohol level:  No results found for: [facility]  Metabolic Disorder Labs: Lab Results  Component Value Date   HGBA1C 5.5 02/15/2017   MPG 111 02/15/2017   MPG 120 10/16/2015   No results found for: PROLACTIN Lab Results  Component Value Date   CHOL 118 02/15/2017   TRIG 74 02/15/2017   HDL 45 02/15/2017   CHOLHDL 2.6 02/15/2017   VLDL 15 02/15/2017   LDLCALC 58 02/15/2017   Physical Findings: AIMS: Facial and Oral Movements Muscles of Facial Expression: None, normal Lips and Perioral Area: None, normal Jaw: None, normal Tongue: None, normal,Extremity Movements Upper (arms, wrists, hands, fingers): None, normal Lower  (legs, knees, ankles, toes): None, normal, Trunk Movements Neck, shoulders, hips: None, normal, Overall Severity Severity of abnormal movements (highest score from questions above): None, normal Incapacitation due to abnormal movements: None, normal Patient's awareness of abnormal movements (rate only patient's report): No Awareness, Dental Status Current problems with teeth and/or dentures?: No Does patient usually wear dentures?: No  CIWA:    COWS:     Musculoskeletal: Strength & Muscle Tone: within normal limits Gait & Station: normal Patient leans: N/A  Psychiatric Specialty Exam: Physical Exam: Nurses' notes & Vital signs reviewed, stable.  Review of Systems  Constitutional: Negative.   HENT: Negative.   Eyes: Negative.   Respiratory: Negative.   Cardiovascular: Negative.   Gastrointestinal: Negative.   Genitourinary: Negative.   Musculoskeletal: Negative.   Skin: Negative.   Neurological: Negative.   Endo/Heme/Allergies: Negative.   Psychiatric/Behavioral:       As above    Blood pressure 124/72, pulse 81, temperature 98.4 F (36.9 C), temperature source Oral, resp. rate 16, height 5\' 5"  (1.651 m), weight 49.9 kg (110 lb), SpO2 100 %.Body mass index is 18.3 kg/m.  General Appearance: Csually dressed. Not crisply groomed. Calm but a bit withdrawn. Not in any distress.   Eye Contact: Moderate  Speech: Spontaneous, soft spoken  Volume:  Normal  Mood: subjectively feels better.   Affect: Restricted  but mobilizing some positive affect  Thought Process:  logical and linear  Orientation:  Full (Time, Place, and Person)  Thought Content:  No delusional theme. No preoccupation with violent thoughts. No negative ruminations. No obsession.  No hallucination in any modality.   Suicidal Thoughts:  No  Homicidal Thoughts:  No  Memory:  Immediate;   Good  Judgement:  Fair  Insight:  Good  Psychomotor Activity:  Decreased  Concentration:  Good  Recall:  Good  Fund of  Knowledge:  Good  Language:  Good  Akathisia:  Negative  Handed:  Right  AIMS (if indicated):     Assets:  Communication Skills Desire for Improvement Financial Resources/Insurance Housing Leisure Time Physical Health Resilience Social Support Talents/Skills Transportation Vocational/Educational  ADL's:  Intact  Cognition:  WNL  Sleep:  Number of Hours: 6.25     Treatment Plan Summary:   Patient is presenting with biological symptoms of depression associated with suicidal/homicidal thoughts. She has been started on antidepressants and seems to have been responding well. She has comorbid THC use and mood instability. I discussed augmentation with Aripiprazole. Patient consented to treatment after we explored the risks and benefits.    Psychiatric: ? Bipolar Disorder NOS PTSD Borderline PD  Medical: Acne  Psychosocial:   PLAN: 1. Aripiprazole 5 mg daily 2. Encourage unit groups and activities 3. Monitor mood, behavior and interaction with peers 4. Motivational enhancement  5. SW would obtain collateral from her family   Georgiann Cocker, MD, 02/17/2017, 12:05 PMPatient ID: [name] [name], female   DOB: March 20, 1996, 20 y.o.   MRN: [id number]

## 2017-02-17 NOTE — [facility] Suicide Risk Assessment (Signed)
[facility] INPATIENT:  Family/Significant Other Suicide Prevention Education  Suicide Prevention Education:  Contact Attempts: Mardene SpeakMichelle RuthAnn Carasas (pt's friend) [phone] has been identified by the patient as the family member/significant other with whom the patient will be residing, and identified as the person(s) who will aid the patient in the event of a mental health crisis.  With written consent from the patient, two attempts were made to provide suicide prevention education, prior to and/or following the patient's discharge.  We were unsuccessful in providing suicide prevention education.  A suicide education pamphlet was given to the patient to share with family/significant other.  Date and time of first attempt: 02/17/17 at 9:44AM   Pulte HomesHeather N Smart 02/17/2017, 9:45 AM    Marcelino DusterMichelle called CSW back and SPE completed/collateral information obtained. She shared that she and pt's family think that Fleet ContrasRachel shows signs of narcism and Borderline traits in addition to mood swings associated with Bipolar Disorder diagnosis and is worried about pt not following up with aftercare plan. SPE completed. Aftercare plan reviewed and CSW encouraged pt's friend to share her concerns with the patient directly and encourage her to continue with outpatient mental health care.   Trula SladeHeather Smart, MSW, LCSW Clinical Social Worker 02/17/2017 11:49 AM

## 2017-02-18 DIAGNOSIS — F603 Borderline personality disorder: Secondary | ICD-10-CM

## 2017-02-18 NOTE — Progress Notes (Signed)
Due to pt not discharging today, CSW rescheduled pt's hospital [facility]. Pt will have a med management appointment on 3/19. Pt states she will receive therapy at her school and does not want her records released to the office. Pt declines CSW offer to make referral and will schedule therapy on her own upon discharge.  Jonathon JordanLynn B Henrique Parekh, MSW, Theresia MajorsLCSWA [phone]

## 2017-02-18 NOTE — Progress Notes (Signed)
Pt was upset and agitated at the beginning of the shift d/t not being discharged today.  She was insistent on seeing her medical records.  Pt was informed that she would need to see the CSW and fill out a form to be able to view her records.  Pt's parents were visiting at the time.  She was raising her voice and yelling at staff.  She went to her room so that she could throw all of her belongings around the room.  She then came back and began yelling about being here.  Writer and her parents were trying to tell her that her actions were not beneficial to argument for discharge, but she was not listening.  Her parents had brought a picture of her puppy which had been left in the lobby.  Pt looked at Clinical research associatewriter and said, "I want my things now!!".  Her parents reminded her that it was only a picture, but pt insisted that the picture be gotten immediately.  Writer informed pt that the shift had just begun and that staff would be going to retrieve all of the belongings that had been brought during visitation at one time.  Pt continued to be angry and stormed back to her room.  Her parents decided to leave at that time, stating that their presence seemed to be making her escalate more.  After her parents left and writer was able to get the picture to the pt, she seemed to settle down.  Staff observed her going to other patients complaining about her situation and she was not receiving the sympathy from her peers that she expected.  She eventually came and got her sleep aid and went to bed.  Earlier, she denied SI/HI/AVH.  Support and encouragement offered.  Discharge plans are in process.  At this time, pt is in bed asleep.  Safety maintained with q15 minute checks.

## 2017-02-18 NOTE — Progress Notes (Signed)
Pt has been called for breakfast at least two times each by RN and MHT.  Pt moves in the bed but does not answer.  Pt was told that the other patients were lining up for breakfast.  Extra effort was made to get pt up for meal.

## 2017-02-18 NOTE — Progress Notes (Signed)
Patient ID: [name] [name], female   DOB: 01/20/1996, 21 y.o.   MRN: [id number]  DAR: Pt. Presents to the medication window with flat affect and depressed mood. She asks for her contacts and a piece of nicorette gum. She refuses her Ensure supplement. She denies SI/HI and A/V Hallucinations. She reports sleep is good, appetite is good, energy level is normal, and concentration is good. She rates depression, hopelessness, and anxiety 0/10. Patient does not report any pain or discomfort at this time. Support and encouragement provided to the patient. Patient remains minimal and guarded with this Clinical research associatewriter. She is seen in the dayroom as the morning progresses talking on the phone and watching television. Patient is behaving in a calm manner at this time. Q15 minute checks are maintained for safety.

## 2017-02-18 NOTE — [facility] Group Notes (Addendum)
[facility] LCSW Group Therapy  02/18/2017 1:15pm  Type of Therapy: Group Therapy   Topic: Overcoming Obstacles  Participation Level: Active  Participation Quality: Appropriate   Affect: Appropriate  Cognitive: Appropriate and Oriented  Insight: Developing/Improving and Improving  Engagement in Therapy: Improving  Modes of Intervention: Discussion, Exploration, Problem-solving and Support  Description of Group:  In this group patients will be encouraged to explore what they see as obstacles to their own wellness and recovery. They will be guided to discuss their thoughts, feelings, and behaviors related to these obstacles. The group will process together ways to cope with barriers, with attention given to specific choices patients can make. Each patient will be challenged to identify changes they are motivated to make in order to overcome their obstacles. This group will be process-oriented, with patients participating in exploration of their own experiences as well as giving and receiving support and challenge from other group members.  Summary of Patient Progress:  Pt states that not being able to discharge from the hospital is one of her obstacles at the moment. Pt states this is frustrating because she feels that she is ready for discharge and that the staff is holding her back. CSW questioned what things pt has done today to mover herself closer to the goal of discharge. Pt was unable to identify anything specific.   Therapeutic Modalities:  Cognitive Behavioral Therapy Solution Focused Therapy Motivational Interviewing Relapse Prevention Therapy  Jonathon JordanLynn B Jameal Razzano, MSW, Theresia MajorsLCSWA [phone]

## 2017-02-18 NOTE — Progress Notes (Signed)
[facility] Group Notes:  (Nursing/MHT/Case Management/Adjunct)  Date:  02/18/2017  Time:  11:16 AM  Type of Therapy:  Nurse Education  Participation Level:  Active  Participation Quality:  Appropriate  Affect:  Anxious  Cognitive:  Alert and Oriented  Insight:  Lacking  Engagement in Group:  Developing/Improving  Modes of Intervention:  Activity, Discussion, Education, Socialization and Support  Summary of Progress/Problems: Pt was seeing the doctor during half of the group. She reports that her goal for today was to talk with the doctor without getting upset and she reports that she completed her goal. Pt participated in aromatherapy.    Beatrix ShipperWright, Kynzley Dowson Martin 02/18/2017, 11:16 AM

## 2017-02-18 NOTE — Progress Notes (Signed)
Pt attend group. 

## 2017-02-18 NOTE — Progress Notes (Signed)
Recreation Therapy Notes  Date: 02/18/17 Time: 0930 Location: [address] Dayroom  Group Topic: Stress Management  Goal Area(s) Addresses:  Patient will verbalize importance of using healthy stress management.  Patient will identify positive emotions associated with healthy stress management.   Intervention: Stress Management  Activity :  Guided Imagery.  LRT introduced the stress management technique of guided imagery.  LRT read a script to allow patients to go one a mental vacation.  Patients were to follow along as LRT read script to engage in the activity.  Education:  Stress Management, Discharge Planning.   Education Outcome: Acknowledges edcuation/In group clarification offered/Needs additional education  Clinical Observations/Feedback: Pt did not attend group.   Terrell Shimko, LRT/CTRS         [name] [name] A 02/18/2017 1:11 PM 

## 2017-02-18 NOTE — Progress Notes (Signed)
[facility] MD Progress Note  02/18/2017 9:14 AM ASHTEN SARNOWSKI  MRN:  [id number]  Subjective:  21 yo Caucasian female, single, lives with her roommate. Background history of Bipolar Disorder and PTSD. History of self mutilation and recent trauma. Electronics engineer and works part time. Patient was brought in by her father. Patient called her family and reported mood swings. She reported crying spells, poor sleep, not eating at night. She reported thoughts of wrecking her car or stabbing herself. She reported thoughts of going after others with a stake knife. Patient has a history of THC use. She has been off psychotropic medications for over six months. She was started on Mirtazapine during this admission.  I spoke with her father Glendell Docker today on [phone]. He reports a chronic pattern of mood swings. Says it shifts rapidly within minutes. Says prior to the call he got from her, patient had been relatively doing well. Says patient lives at [place] while they live at [place]. Says when she called he advised her to come home immediately. Says she passed the night and came back the next day. Says patient refused to give him details of her distress. She was crying and sobbing hence he decided to bring her to the hospital. Glendell Docker reports history of self mutilation. Notes that she gets dramatic and exaggerates at times. Glendell Docker is not aware of any other safety concerns. Says he wants her to get better. States that she was raging when he came yesterday. She called him this morning reporting being angry with staff here.   Nursing staff notes that she engages with peers. She is pleasant one minute and then gets irritated if her demands are not met. She refused to get up for breakfast this morning. She denies any thoughts of suicide. She denies any thoughts of homicide. She has not been observed to be internally stimulated.   Seen today. Patient has been taking her medications as prescribed. States that she is tolerating  them well. Patient talked about her behavior yesterday. Says she was frustrated because she felt she was supposed to leave yesterday. I explain concerns about safety and how she dealt with her frustration. Patient states that she works with her therapist at school. Says they work on thought pyramids. I encouraged patient to use the coping tools she learnt there in dealing with frustration here. I encouraged her to attend unit groups so she can add to her tool box.  Patient denies hallucinations in all modalities. She denies any current thoughts of suicide. Patient denies any current thought of homicide. She denies any thoughts of violence. Patient denies any cravings for substances. Patient reports that she is able to think clearly. No racing thoughts. Says she slept well last night  Principal Problem: Borderline PD                                  Substance Induced Mood Disorder                                  ?? Bipolar disorder Diagnosis:   Patient Active Problem List   Diagnosis Date Noted  . PTSD (post-traumatic stress disorder) [F43.10] 02/15/2017  . Cannabis use disorder, severe, dependence ([place]) [F12.20] 02/15/2017  . Suicidal ideations [R45.851]   . Mild major depression, single episode ([place]) [F32.0] 10/15/2015  . Adjustment disorder with anxious mood [F43.22]  10/14/2015  . Irregular menstrual cycle [N92.6] 06/19/2014   Total Time spent with patient: 25 minutes  Past Psychiatric History: PTSD, MDD, Adjustment disorder with anxious mood.  Past Medical History:  Past Medical History:  Diagnosis Date  . Bipolar 1 disorder, depressed ([place]) 01/2016  . Irregular menstrual cycle 06/19/2014  . Mild major depression, single episode ([place]) 10/15/2015  . PTSD (post-traumatic stress disorder) 05/2016  . Substance abuse 05/08/2016   Went to Rehab on 05/08/16 for 2 months.     Past Surgical History:  Procedure Laterality Date  . MOUTH SURGERY  4 years ago    Family History:  Family History   Problem Relation Age of Onset  . Prostate cancer Father   . Diabetes Mellitus II Father   . Hypertension Father   . Diabetes Mellitus II Paternal Grandmother   . Depression Paternal Grandmother   . Hypertension Paternal Grandmother   . Diabetes Mellitus II Paternal Grandfather   . Hypertension Paternal Grandfather   . Fibroids Maternal Grandmother   . Osteoporosis Maternal Grandmother    Family Psychiatric  History: See H&P  Social History:  History  Alcohol Use  . 1.8 oz/week  . 3 Standard drinks or equivalent per week     History  Drug Use  . Types: Marijuana    Comment: Clean for 2 months (went to rehab 05/08/16)    Social History   Social History  . Marital status: Single    Spouse name: N/A  . Number of children: N/A  . Years of education: N/A   Social History Main Topics  . Smoking status: Current Every Day Smoker    Types: Cigarettes  . Smokeless tobacco: Never Used     Comment: 3 cigarettes a day  . Alcohol use 1.8 oz/week    3 Standard drinks or equivalent per week  . Drug use: Yes    Types: Marijuana     Comment: Clean for 2 months (went to rehab 05/08/16)  . Sexual activity: Yes    Partners: Male    Birth control/ protection: Pill, Condom   Other Topics Concern  . None   Social History Narrative  . None   Additional Social History:    Pain Medications: Pt denies use Prescriptions: Pt denies use Over the Counter: Pt denies use History of alcohol / drug use?: Yes Longest period of sobriety (when/how long): Unknown Negative Consequences of Use:  (Pt denies) Withdrawal Symptoms:  (Pt denies) Name of Substance 1: Marijuana 1 - Age of First Use: 17 1 - Amount (size/oz): 5-6 grams 1 - Frequency: Daily 1 - Duration: Three years 1 - Last Use / Amount: 02/13/17, 5 grams  Sleep: Good  Appetite:  Good  Current Medications: Current Facility-Administered Medications  Medication Dose Route Frequency Provider Last Rate Last Dose  . acetaminophen  (TYLENOL) tablet 650 mg  650 mg Oral Q6H PRN Rozetta Nunnery, NP      . alum & mag hydroxide-simeth (MAALOX/MYLANTA) 200-200-20 MG/5ML suspension 30 mL  30 mL Oral Q4H PRN Rozetta Nunnery, NP      . feeding supplement (ENSURE ENLIVE) (ENSURE ENLIVE) liquid 237 mL  237 mL Oral BID BM Myer Peer Cobos, MD      . hydrOXYzine (ATARAX/VISTARIL) tablet 25 mg  25 mg Oral TID PRN Rozetta Nunnery, NP      . magnesium hydroxide (MILK OF MAGNESIA) suspension 30 mL  30 mL Oral Daily PRN Rozetta Nunnery, NP      .  mirtazapine (REMERON) tablet 15 mg  15 mg Oral QHS Aundra Dubin, MD   15 mg at 02/17/17 2157  . nicotine polacrilex (NICORETTE) gum 2 mg  2 mg Oral PRN Jenne Campus, MD   2 mg at 02/17/17 2133    Lab Results:  No results found for this or any previous visit (from the past 1 hour(s)). Blood Alcohol level:  No results found for: [facility]  Metabolic Disorder Labs: Lab Results  Component Value Date   HGBA1C 5.5 02/15/2017   MPG 111 02/15/2017   MPG 120 10/16/2015   No results found for: PROLACTIN Lab Results  Component Value Date   CHOL 118 02/15/2017   TRIG 74 02/15/2017   HDL 45 02/15/2017   CHOLHDL 2.6 02/15/2017   VLDL 15 02/15/2017   LDLCALC 58 02/15/2017   Physical Findings: AIMS: Facial and Oral Movements Muscles of Facial Expression: None, normal Lips and Perioral Area: None, normal Jaw: None, normal Tongue: None, normal,Extremity Movements Upper (arms, wrists, hands, fingers): None, normal Lower (legs, knees, ankles, toes): None, normal, Trunk Movements Neck, shoulders, hips: None, normal, Overall Severity Severity of abnormal movements (highest score from questions above): None, normal Incapacitation due to abnormal movements: None, normal Patient's awareness of abnormal movements (rate only patient's report): No Awareness, Dental Status Current problems with teeth and/or dentures?: No Does patient usually wear dentures?: No  CIWA:    COWS:      Musculoskeletal: Strength & Muscle Tone: within normal limits Gait & Station: normal Patient leans: N/A  Psychiatric Specialty Exam: Physical Exam: Nurses' notes & Vital signs reviewed, stable.  Review of Systems  Constitutional: Negative.   HENT: Negative.   Eyes: Negative.   Respiratory: Negative.   Cardiovascular: Negative.   Gastrointestinal: Negative.   Genitourinary: Negative.   Musculoskeletal: Negative.   Skin: Negative.   Neurological: Negative.   Endo/Heme/Allergies: Negative.   Psychiatric/Behavioral:       As above    Blood pressure 116/60, pulse 91, temperature 98.6 F (37 C), temperature source Oral, resp. rate 16, height '5\' 5"'  (1.651 m), weight 49.9 kg (110 lb), SpO2 100 %.Body mass index is 18.3 kg/m.  General Appearance: Calmer today. Better eye contact today. Not in any distress. Not internally stimulated.   Eye Contact: Good  Speech: Spontaneous, normal rate tone and volume.   Volume:  Normal  Mood: Better today  Affect: Full range and appropriate  Thought Process:  logical and linear  Orientation:  Full (Time, Place, and Person)  Thought Content:  No delusional theme. No preoccupation with violent thoughts. No negative ruminations. No obsession.  No hallucination in any modality.   Suicidal Thoughts:  No  Homicidal Thoughts:  No  Memory:  Immediate;   Good  Judgement:  Better  Insight:  Good  Psychomotor Activity:  Normal  Concentration:  Good  Recall:  Good  Fund of Knowledge:  Good  Language:  Good  Akathisia:  Negative  Handed:  Right  AIMS (if indicated):     Assets:  Communication Skills Desire for Improvement Financial Resources/Insurance Housing Leisure Time [place] Talents/Skills Transportation Vocational/Educational  ADL's:  Intact  Cognition:  WNL  Sleep:  Number of Hours: 6.25     Treatment Plan Summary:   Patient is better today. No evidence of dissociation. No evidence of psychosis.  Patient is more rational. We would evaluate her further.    Psychiatric: ? Bipolar Disorder NOS PTSD Borderline PD  Medical: Acne  Psychosocial:  PLAN: 1. Continue current regimen 2. Encourage unit groups and activities 3. Monitor mood, behavior and interaction with peers    Artist Beach, MD, 02/18/2017, 9:14 AMPatient ID: [name], female   DOB: 04/22/1996, 21 y.o.   MRN: [id number] Patient ID: AUDREYANA HUNTSBERRY, female   DOB: 07-22-1996, 21 y.o.   MRN: [id number]

## 2017-02-19 MED ORDER — HYDROXYZINE HCL 25 MG PO TABS: 25.0000 mg | ORAL_TABLET | Freq: Three times a day (TID) | ORAL | 0 refills | Status: DC | PRN

## 2017-02-19 MED ORDER — MIRTAZAPINE 15 MG PO TABS: 15.0000 mg | ORAL_TABLET | Freq: Every day | ORAL | 0 refills | Status: DC

## 2017-02-19 MED ORDER — NICOTINE POLACRILEX 2 MG MT GUM: 2.0000 mg | CHEWING_GUM | OROMUCOSAL | 0 refills | Status: DC | PRN

## 2017-02-19 NOTE — [facility] Suicide Risk Assessment (Signed)
[facility]   Principal Problem: Borderline personality disorder Discharge Diagnoses:  Patient Active Problem List   Diagnosis Date Noted  . Borderline personality disorder [F60.3] 02/18/2017  . PTSD (post-traumatic stress disorder) [F43.10] 02/15/2017  . Cannabis use disorder, severe, dependence ([facility]) [F12.20] 02/15/2017  . Suicidal ideations [R45.851]   . Mild major depression, single episode ([facility]) [F32.0] 10/15/2015  . Adjustment disorder with anxious mood [F43.22] 10/14/2015  . Irregular menstrual cycle [N92.6] 06/19/2014    Total Time spent with patient: 30 minutes  Musculoskeletal: Strength & Muscle Tone: within normal limits Gait & Station: normal Patient leans: N/A  Psychiatric Specialty Exam: Review of Systems  Constitutional: Negative.   HENT: Negative.   Eyes: Negative.   Respiratory: Negative.   Cardiovascular: Negative.   Gastrointestinal: Negative.   Genitourinary: Negative.   Musculoskeletal: Negative.   Skin: Negative.   Neurological: Negative.   Endo/Heme/Allergies: Negative.   Psychiatric/Behavioral: Negative for depression, hallucinations, memory loss, substance abuse and suicidal ideas. The patient is not nervous/anxious and does not have insomnia.     Blood pressure 115/64, pulse (!) 115, temperature 98.7 F (37.1 C), temperature source Oral, resp. rate 16, height 5\' 5"  (1.651 m), weight 49.9 kg (110 lb), SpO2 100 %.Body mass index is 18.3 kg/m.  General Appearance: Neatly dressed, pleasant, engaging well and cooperative. Appropriate behavior. Not in any distress. Good relatedness. Not internally stimulated  Eye Contact::  Good  Speech:  Spontaneous, normal prosody. Normal tone and rate.   Volume:  Normal  Mood:  Euthymic  Affect:  Appropriate and Full Range  Thought Process:  Goal Directed and Linear  Orientation:  Full (Time, Place, and Person)  Thought Content:  No delusional theme. No preoccupation with violent  thoughts. No negative ruminations. No obsession.  No hallucination in any modality.   Suicidal Thoughts:  No  Homicidal Thoughts:  No  Memory:  Immediate;   Good Recent;   Good Remote;   Good  Judgement:  Good  Insight:  Partial  Psychomotor Activity:  Normal  Concentration:  Good  Recall:  Good  Fund of Knowledge:Good  Language: Good  Akathisia:  No  Handed:    AIMS (if indicated):     Assets:  Engineer, maintenanceCommunication Skills Housing Physical Health Resilience Social Support Vocational/Educational  Sleep:  Number of Hours: 6  Cognition: WNL  ADL's:  Intact   Clinical  Assessment::   21 yo Caucasian female, single, lives with her roommate. Background history of Bipolar Disorder and PTSD. History of self mutilation and recent trauma. ArchivistCollege student and works part time. Patient was brought in by her father. Patient called her family and reported mood swings. She reported crying spells, poor sleep, not eating at night. She reported thoughts of wrecking her car or stabbing herself. She reported thoughts of going after others with a stake knife. Patient has a history of THC use. She has been off psychotropic medications for over six months. She was started on Mirtazapine during this admission.  Seen today. Patient says she is feeling well. Says she has not had any thoughts of suicide or homicide since she has been here. No thought of violence.Says she in good spirits. Feels she is able to think clearly. Says she has not felt as if her surroundings or herself is unreal. No evidence of dissociation. No hallucination or illusion. No delusional theme. No passivity of will. No passivity of thought. Says she has normal appetite, normal energy levels. She is sleeping well at night.  I explored how she would deal with the thoughts if it comes back. Patient says her employers knows she has mental health issues. Says her boss would give her some timeout if she feels weird at work. Patient says she has been  doing therapy once a month. Says she plans to increase it to once weekly.   Nursing staff reports that she has not expressed any thoughts of violence. She has not been observed to be internally stimulated. She has not been aggressive on the unit. Interpersonal relationship is usually emotionally charged. She tends to split staff members. These is her baseline character.   Patient was discussed at team. Team members feels that patient is back to her baseline level of function. Team agrees with plan to discharge patient today.   Demographic Factors:  Caucasian  Loss Factors: NA  Historical Factors: Impulsivity and self mutilation  Risk Reduction Factors:   Sense of responsibility to family, Living with another person, especially a relative, Positive social support, Positive therapeutic relationship, Positive coping skills or problem solving skills and responsibility to her pet  Continued Clinical Symptoms:  As above   Cognitive Features That Contribute To Risk:  None    Suicide Risk:  Minimal: No identifiable suicidal ideation.  Patient is not having any thoughts of suicide at this time. Modifiable risk factors targeted during this admission includes rapid mood swings. Demographical and historical risk factors cannot be modified. Patient is now engaging well. Patient is reliable and is future oriented. We have buffered patient's support structures. At this point, patient is at low risk of suicide. Patient is aware of the effects of psychoactive substances on decision making process. Patient has been provided with emergency contacts. Patient acknowledges to use resources provided if unforseen circumstances changes their current risk stratification.    Follow-up Information    [facility] Follow up on 02/23/2017.   Why:  Hospital follow-up/appointment for medication management on Thursday, 02/23/17 at 8:00AM with Forde Radon. Please arrive by 7:45AM to complete  paperwork and bring insurance card to this appt. Thank you.  Contact information: [address] [place], [place] [address] Phone: [phone] Fax: [phone]          Plan Of Care/Follow-up recommendations:  1. Continue current psychotropic medications 2. Mental health and addiction follow up as arranged.  3. Discharge in care of their family 4. Provided limited quantity of prescriptions   Georgiann Cocker, MD 02/19/2017, 11:18 AM

## 2017-02-19 NOTE — Progress Notes (Signed)
Pt's attitude has been somewhat better this evening.  Her parents came to visit, and pt was able to remain in control of her behavior.  She says she was told that she would be discharged tomorrow if she were able to stay calm.  She is still arrogant and rude when dealing with staff, but that seems to be her baseline and norm.  She was able to carry on a normal conversation with Clinical research associatewriter when she took her night time med.  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  Pt denies SI/HI/AVH.  She voices no needs or concerns this evening.  Pt plans to return home with her parents who will take her back to school in [place] to finish the semester.  Support and encouragement offered.  Discharge plans are in process.  Safety maintained with q15 minute checks.
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
## 2017-02-19 NOTE — Progress Notes (Signed)
Pt attend group with NA 

## 2017-02-19 NOTE — Progress Notes (Signed)
  [facility] Adult Case Management Discharge Plan :  Will you be returning to the same living situation after discharge:  Yes,  campus at school At discharge, do you have transportation home?: Yes, pt's father Do you have the ability to pay for your medications: Yes,  [employer]  Release of information consent forms completed and submitted to medical records by CSW. Patient to Follow up at: Follow-up Information    [facility] Follow up on 02/23/2017.   Why:  Hospital follow-up/appointment rescheduled for medication management on Thursday, 02/23/17 at 8:00AM with Forde RadonFrances Penland. Please arrive by 7:45AM and bring insurance card. If you want to see a different provider, please call office. Thank you.  Contact information: [address] [place], [place] [address] Phone: [phone] Fax: [phone]        Pt plans to make her own therapy appt with on campus therapist and DOES NOT want records sent to her college.   Next level of care provider has access to [facility] Link:no  Safety Planning and Suicide Prevention discussed: Yes,  SPE completed with pt's best friend; SPE also completed with pt.  Have you used any form of tobacco in the last 30 days? (Cigarettes, Smokeless Tobacco, Cigars, and/or Pipes): Yes  Has patient been referred to the Quitline?: Patient refused referral  Patient has been referred for addiction treatment: Yes  Mccall Will N Smart LCSW 02/19/2017, 11:36 AM

## 2017-02-19 NOTE — Progress Notes (Signed)
Pt d/c from the hospital. All items returned. D/C instructions given and prescriptions given. Pt denies si and hi. 

## 2017-02-19 NOTE — Tx Team (Signed)
Interdisciplinary Treatment and Diagnostic Plan Update  02/19/2017 Time of Session: 0930 [name] [name] MRN: [id number]  Principal Diagnosis: Borderline personality disorder  Secondary Diagnoses: Principal Problem:   Borderline personality disorder Active Problems:   Suicidal ideations   PTSD (post-traumatic stress disorder)   Cannabis use disorder, severe, dependence ([place])   Current Medications:  Current Facility-Administered Medications  Medication Dose Route Frequency Provider Last Rate Last Dose  . acetaminophen (TYLENOL) tablet 650 mg  650 mg Oral Q6H PRN Rozetta Nunnery, NP      . alum & mag hydroxide-simeth (MAALOX/MYLANTA) 200-200-20 MG/5ML suspension 30 mL  30 mL Oral Q4H PRN Rozetta Nunnery, NP      . feeding supplement (ENSURE ENLIVE) (ENSURE ENLIVE) liquid 237 mL  237 mL Oral BID BM Myer Peer Cobos, MD      . hydrOXYzine (ATARAX/VISTARIL) tablet 25 mg  25 mg Oral TID PRN Rozetta Nunnery, NP      . magnesium hydroxide (MILK OF MAGNESIA) suspension 30 mL  30 mL Oral Daily PRN Rozetta Nunnery, NP      . mirtazapine (REMERON) tablet 15 mg  15 mg Oral QHS Aundra Dubin, MD   15 mg at 02/18/17 2232  . nicotine polacrilex (NICORETTE) gum 2 mg  2 mg Oral PRN Jenne Campus, MD   2 mg at 02/19/17 1059   PTA Medications: Prescriptions Prior to Admission  Medication Sig Dispense Refill Last Dose  . buPROPion (WELLBUTRIN) 75 MG tablet    Taking  . lamoTRIgine (LAMICTAL) 25 MG tablet    Taking  . metroNIDAZOLE (METROGEL) 0.75 % vaginal gel Place 1 Applicatorful vaginally at bedtime. 70 g 0   . prazosin (MINIPRESS) 1 MG capsule TAKE 1 CAPSULE BY ORAL ROUTE 1 TIME PER DAY  3 Taking  . traZODone (DESYREL) 50 MG tablet Take 1 tablet (50 mg total) by mouth at bedtime as needed for sleep. 30 tablet 0 Taking    Patient Stressors: Marital or family conflict Occupational concerns Substance abuse Traumatic event  Patient Strengths: Average or above average intelligence Capable of  independent living Supportive family/friends Work skills  Treatment Modalities: Medication Management, Group therapy, Case management,  1 to 1 session with clinician, Psychoeducation, Recreational therapy.   Physician Treatment Plan for Primary Diagnosis: Borderline personality disorder Long Term Goal(s): Improvement in symptoms so as ready for discharge Improvement in symptoms so as ready for discharge   Short Term Goals: Ability to identify changes in lifestyle to reduce recurrence of condition will improve Ability to verbalize feelings will improve Ability to disclose and discuss suicidal ideas Ability to demonstrate self-control will improve Ability to identify and develop effective coping behaviors will improve Ability to maintain clinical measurements within normal limits will improve Compliance with prescribed medications will improve Ability to identify triggers associated with substance abuse/mental health issues will improve Ability to identify changes in lifestyle to reduce recurrence of condition will improve Ability to verbalize feelings will improve Ability to disclose and discuss suicidal ideas Ability to demonstrate self-control will improve Ability to identify and develop effective coping behaviors will improve Ability to maintain clinical measurements within normal limits will improve Compliance with prescribed medications will improve Ability to identify triggers associated with substance abuse/mental health issues will improve  Medication Management: Evaluate patient's response, side effects, and tolerance of medication regimen.  Therapeutic Interventions: 1 to 1 sessions, Unit Group sessions and Medication administration.  Evaluation of Outcomes: Met  Physician Treatment Plan for Secondary Diagnosis: Principal  Problem:   Borderline personality disorder Active Problems:   Suicidal ideations   PTSD (post-traumatic stress disorder)   Cannabis use disorder,  severe, dependence ([place])  Long Term Goal(s): Improvement in symptoms so as ready for discharge Improvement in symptoms so as ready for discharge   Short Term Goals: Ability to identify changes in lifestyle to reduce recurrence of condition will improve Ability to verbalize feelings will improve Ability to disclose and discuss suicidal ideas Ability to demonstrate self-control will improve Ability to identify and develop effective coping behaviors will improve Ability to maintain clinical measurements within normal limits will improve Compliance with prescribed medications will improve Ability to identify triggers associated with substance abuse/mental health issues will improve Ability to identify changes in lifestyle to reduce recurrence of condition will improve Ability to verbalize feelings will improve Ability to disclose and discuss suicidal ideas Ability to demonstrate self-control will improve Ability to identify and develop effective coping behaviors will improve Ability to maintain clinical measurements within normal limits will improve Compliance with prescribed medications will improve Ability to identify triggers associated with substance abuse/mental health issues will improve     Medication Management: Evaluate patient's response, side effects, and tolerance of medication regimen.  Therapeutic Interventions: 1 to 1 sessions, Unit Group sessions and Medication administration.  Evaluation of Outcomes: Met   RN Treatment Plan for Primary Diagnosis: Borderline personality disorder Long Term Goal(s): Knowledge of disease and therapeutic regimen to maintain health will improve  Short Term Goals: Ability to remain free from injury will improve, Ability to verbalize feelings will improve and Ability to disclose and discuss suicidal ideas  Medication Management: RN will administer medications as ordered by provider, will assess and evaluate patient's response and provide  education to patient for prescribed medication. RN will report any adverse and/or side effects to prescribing provider.  Therapeutic Interventions: 1 on 1 counseling sessions, Psychoeducation, Medication administration, Evaluate responses to treatment, Monitor vital signs and CBGs as ordered, Perform/monitor CIWA, COWS, AIMS and Fall Risk screenings as ordered, Perform wound care treatments as ordered.  Evaluation of Outcomes: Met   LCSW Treatment Plan for Primary Diagnosis: Borderline personality disorder Long Term Goal(s): Safe transition to appropriate next level of care at discharge, Engage patient in therapeutic group addressing interpersonal concerns.  Short Term Goals: Engage patient in aftercare planning with referrals and resources, Facilitate patient progression through stages of change regarding substance use diagnoses and concerns and Identify triggers associated with mental health/substance abuse issues  Therapeutic Interventions: Assess for all discharge needs, 1 to 1 time with Social worker, Explore available resources and support systems, Assess for adequacy in community support network, Educate family and significant other(s) on suicide prevention, Complete Psychosocial Assessment, Interpersonal group therapy.  Evaluation of Outcomes: Met  Progress in Treatment: Attending groups: Yes. Participating in groups: Yes. Taking medication as prescribed: Yes. Toleration medication: Yes. Family/Significant other contact made: SPE completed with pt's best friend. Collateral information also obtained.  Patient understands diagnosis: Yes. Discussing patient identified problems/goals with staff: Yes. Medical problems stabilized or resolved: Yes. Denies suicidal/homicidal ideation: Yes. Issues/concerns per patient self-inventory: No. Other: n/a  New problem(s) identified: No, Describe:  n/a  New Short Term/Long Term Goal(s): detox; medication stabilization; development of  comprehensive mental wellness/sobriety plan.   Discharge Plan or Barriers: Pt plans to return to school ([employer]) at discharge and has follow-up with psychiatrist at [facility]. She plans to follow-up with counselor from school as well and was adamant that she would make  her own appt and DOES NOT want records sent to school. Pt's friend given information to various DBT specialists in [place] per her request as well.   Reason for Continuation of Hospitalization: none  Estimated Length of Stay:  Discharge today   Attendees: Patient: 02/19/2017 11:37 AM  Physician: Dr. Sanjuana Letters MD 02/19/2017 11:37 AM  Nursing: Duwaine Maxin RN 02/19/2017 11:37 AM  RN Care Manager: Lars Pinks CM 02/19/2017 11:37 AM  Social Worker: Press photographer, LCSW; Matthew Saras [place] 02/19/2017 11:37 AM  Recreational Therapist: Rhunette Croft 02/19/2017 11:37 AM  Other: Lindell Spar NP; May Augustin NP 02/19/2017 11:37 AM  Other:  02/19/2017 11:37 AM  Other: 02/19/2017 11:37 AM    Scribe for Treatment Team: [place], LCSW 02/19/2017 11:37 AM

## 2017-07-27 ENCOUNTER — Ambulatory Visit: Payer: 59 | Admitting: Nurse Practitioner

## 2017-08-05 ENCOUNTER — Other Ambulatory Visit (HOSPITAL_COMMUNITY)
Admission: RE | Admit: 2017-08-05 | Discharge: 2017-08-05 | Disposition: A | Discharge status: Home / Self Care | Payer: 59 | Source: Ambulatory Visit | Attending: Certified Nurse Midwife | Admitting: Certified Nurse Midwife

## 2017-08-05 ENCOUNTER — Ambulatory Visit (INDEPENDENT_AMBULATORY_CARE_PROVIDER_SITE_OTHER): Payer: 59 | Admitting: Certified Nurse Midwife

## 2017-08-05 ENCOUNTER — Encounter: Payer: Self-pay | Admitting: Certified Nurse Midwife

## 2017-08-05 VITALS — BP 90/60 | HR 64 | Resp 16 | Ht 64.75 in | Wt 112.0 lb

## 2017-08-05 DIAGNOSIS — Z124 Encounter for screening for malignant neoplasm of cervix: Secondary | ICD-10-CM

## 2017-08-05 DIAGNOSIS — N912 Amenorrhea, unspecified: Secondary | ICD-10-CM | POA: Insufficient documentation

## 2017-08-05 DIAGNOSIS — Z01419 Encounter for gynecological examination (general) (routine) without abnormal findings: Secondary | ICD-10-CM

## 2017-08-05 LAB — POCT URINE PREGNANCY: Preg Test, Ur: NEGATIVE

## 2017-08-05 NOTE — Patient Instructions (Signed)
General topics  Next pap or exam is  due in 1 year Take a Women's multivitamin Take 1200 mg. of calcium daily - prefer dietary If any concerns in interim to call back  Breast Self-Awareness Practicing breast self-awareness may pick up problems early, prevent significant medical complications, and possibly save your life. By practicing breast self-awareness, you can become familiar with how your breasts look and feel and if your breasts are changing. This allows you to notice changes early. It can also offer you some reassurance that your breast health is good. One way to learn what is normal for your breasts and whether your breasts are changing is to do a breast self-exam. If you find a lump or something that was not present in the past, it is best to contact your caregiver right away. Other findings that should be evaluated by your caregiver include nipple discharge, especially if it is bloody; skin changes or reddening; areas where the skin seems to be pulled in (retracted); or new lumps and bumps. Breast pain is seldom associated with cancer (malignancy), but should also be evaluated by a caregiver. BREAST SELF-EXAM The best time to examine your breasts is 5 7 days after your menstrual period is over.  ExitCare Patient Information 2013 [employer].   Exercise to Stay Healthy Exercise helps you become and stay healthy. EXERCISE IDEAS AND TIPS Choose exercises that:  You enjoy.  Fit into your day. You do not need to exercise really hard to be healthy. You can do exercises at a slow or medium level and stay healthy. You can:  Stretch before and after working out.  Try yoga, Pilates, or tai chi.  Lift weights.  Walk fast, swim, jog, run, climb stairs, bicycle, dance, or rollerskate.  Take aerobic classes. Exercises that burn about 150 calories:  Running 1  miles in 15 minutes.  Playing volleyball for 45 to 60 minutes.  Washing and waxing a car for 45 to 60  minutes.  Playing touch football for 45 minutes.  Walking 1  miles in 35 minutes.  Pushing a stroller 1  miles in 30 minutes.  Playing basketball for 30 minutes.  Raking leaves for 30 minutes.  Bicycling 5 miles in 30 minutes.  Walking 2 miles in 30 minutes.  Dancing for 30 minutes.  Shoveling snow for 15 minutes.  Swimming laps for 20 minutes.  Walking up stairs for 15 minutes.  Bicycling 4 miles in 15 minutes.  Gardening for 30 to 45 minutes.  Jumping rope for 15 minutes.  Washing windows or floors for 45 to 60 minutes. Document Released: 12/27/2010 Document Revised: 02/16/2012 Document Reviewed: 12/27/2010 ExitCare Patient Information 2013 ExitCare[employer].   Other topics ( that may be useful information):    Sexually Transmitted Disease Sexually transmitted disease (STD) refers to any infection that is passed from person to person during sexual activity. This may happen by way of saliva, semen, blood, vaginal mucus, or urine. Common STDs include:  Gonorrhea.  Chlamydia.  Syphilis.  HIV/AIDS.  Genital herpes.  Hepatitis B and C.  Trichomonas.  Human papillomavirus (HPV).  Pubic lice. CAUSES  An STD may be spread by bacteria, virus, or parasite. A person can get an STD by:  Sexual intercourse with an infected person.  Sharing sex toys with an infected person.  Sharing needles with an infected person.  Having intimate contact with the genitals, mouth, or rectal areas of an infected person. SYMPTOMS  Some people may not have any symptoms, but   they can still pass the infection to others. Different STDs have different symptoms. Symptoms include:  Painful or bloody urination.  Pain in the pelvis, abdomen, vagina, anus, throat, or eyes.  Skin rash, itching, irritation, growths, or sores (lesions). These usually occur in the genital or anal area.  Abnormal vaginal discharge.  Penile discharge in men.  Soft, flesh-colored skin growths in the  genital or anal area.  Fever.  Pain or bleeding during sexual intercourse.  Swollen glands in the groin area.  Yellow skin and eyes (jaundice). This is seen with hepatitis. DIAGNOSIS  To make a diagnosis, your caregiver may:  Take a medical history.  Perform a physical exam.  Take a specimen (culture) to be examined.  Examine a sample of discharge under a microscope.  Perform blood test TREATMENT   Chlamydia, gonorrhea, trichomonas, and syphilis can be cured with antibiotic medicine.  Genital herpes, hepatitis, and HIV can be treated, but not cured, with prescribed medicines. The medicines will lessen the symptoms.  Genital warts from HPV can be treated with medicine or by freezing, burning (electrocautery), or surgery. Warts may come back.  HPV is a virus and cannot be cured with medicine or surgery.However, abnormal areas may be followed very closely by your caregiver and may be removed from the cervix, vagina, or vulva through office procedures or surgery. If your diagnosis is confirmed, your recent sexual partners need treatment. This is true even if they are symptom-free or have a negative culture or evaluation. They should not have sex until their caregiver says it is okay. HOME CARE INSTRUCTIONS  All sexual partners should be informed, tested, and treated for all STDs.  Take your antibiotics as directed. Finish them even if you start to feel better.  Only take over-the-counter or prescription medicines for pain, discomfort, or fever as directed by your caregiver.  Rest.  Eat a balanced diet and drink enough fluids to keep your urine clear or pale yellow.  Do not have sex until treatment is completed and you have followed up with your caregiver. STDs should be checked after treatment.  Keep all follow-up appointments, Pap tests, and blood tests as directed by your caregiver.  Only use latex condoms and water-soluble lubricants during sexual activity. Do not use  petroleum jelly or oils.  Avoid alcohol and illegal drugs.  Get vaccinated for HPV and hepatitis. If you have not received these vaccines in the past, talk to your caregiver about whether one or both might be right for you.  Avoid risky sex practices that can break the skin. The only way to avoid getting an STD is to avoid all sexual activity.Latex condoms and dental dams (for oral sex) will help lessen the risk of getting an STD, but will not completely eliminate the risk. SEEK MEDICAL CARE IF:   You have a fever.  You have any new or worsening symptoms. Document Released: 02/14/2003 Document Revised: 02/16/2012 Document Reviewed: 02/21/2011 [facility] Patient Information 2013 [place].    Domestic Abuse You are being battered or abused if someone close to you hits, pushes, or physically hurts you in any way. You also are being abused if you are forced into activities. You are being sexually abused if you are forced to have sexual contact of any kind. You are being emotionally abused if you are made to feel worthless or if you are constantly threatened. It is important to remember that help is available. No one has the right to abuse you. PREVENTION OF FURTHER  ABUSE  Learn the warning signs of danger. This varies with situations but may include: the use of alcohol, threats, isolation from friends and family, or forced sexual contact. Leave if you feel that violence is going to occur.  If you are attacked or beaten, report it to the police so the abuse is documented. You do not have to press charges. The police can protect you while you or the attackers are leaving. Get the officer's name and badge number and a copy of the report.  Find someone you can trust and tell them what is happening to you: your caregiver, a nurse, clergy member, close friend or family member. Feeling ashamed is natural, but remember that you have done nothing wrong. No one deserves abuse. Document Released:  11/21/2000 Document Revised: 02/16/2012 Document Reviewed: 01/30/2011 ExitCare Patient Information 2013 ExitCare[employer].    How Much is Too Much Alcohol? Drinking too much alcohol can cause injury, accidents, and health problems. These types of problems can include:   Car crashes.  Falls.  Family fighting (domestic violence).  Drowning.  Fights.  Injuries.  Burns.  Damage to certain organs.  Having a baby with birth defects. ONE DRINK CAN BE TOO MUCH WHEN YOU ARE:  Working.  Pregnant or breastfeeding.  Taking medicines. Ask your doctor.  Driving or planning to drive. If you or someone you know has a drinking problem, get help from a doctor.  Document Released: 09/20/2009 Document Revised: 02/16/2012 Document Reviewed: 09/20/2009 ExitCare Patient Information 2013 ExitCare[employer].   Smoking Hazards Smoking cigarettes is extremely bad for your health. Tobacco smoke has over 200 known poisons in it. There are over 60 chemicals in tobacco smoke that cause cancer. Some of the chemicals found in cigarette smoke include:   Cyanide.  Benzene.  Formaldehyde.  Methanol (wood alcohol).  Acetylene (fuel used in welding torches).  Ammonia. Cigarette smoke also contains the poisonous gases nitrogen oxide and carbon monoxide.  Cigarette smokers have an increased risk of many serious medical problems and Smoking causes approximately:  90% of all lung cancer deaths in men.  80% of all lung cancer deaths in women.  90% of deaths from chronic obstructive lung disease. Compared with nonsmokers, smoking increases the risk of:  Coronary heart disease by 2 to 4 times.  Stroke by 2 to 4 times.  Men developing lung cancer by 23 times.  Women developing lung cancer by 13 times.  Dying from chronic obstructive lung diseases by 12 times.  . Smoking is the most preventable cause of death and disease in our society.  WHY IS SMOKING ADDICTIVE?  Nicotine is the chemical  agent in tobacco that is capable of causing addiction or dependence.  When you smoke and inhale, nicotine is absorbed rapidly into the bloodstream through your lungs. Nicotine absorbed through the lungs is capable of creating a powerful addiction. Both inhaled and non-inhaled nicotine may be addictive.  Addiction studies of cigarettes and spit tobacco show that addiction to nicotine occurs mainly during the teen years, when young people begin using tobacco products. WHAT ARE THE BENEFITS OF QUITTING?  There are many health benefits to quitting smoking.   Likelihood of developing cancer and heart disease decreases. Health improvements are seen almost immediately.  Blood pressure, pulse rate, and breathing patterns start returning to normal soon after quitting. QUITTING SMOKING   American Lung Association - 1-800-LUNGUSA  American Cancer Society - 1-800-ACS-2345 Document Released: 01/01/2005 Document Revised: 02/16/2012 Document Reviewed: 09/05/2009 ExitCare Patient Information 2013 ExitCare,   LLC.   Stress Management Stress is a state of physical or mental tension that often results from changes in your life or normal routine. Some common causes of stress are:  Death of a loved one.  Injuries or severe illnesses.  Getting fired or changing jobs.  Moving into a new home. Other causes may be:  Sexual problems.  Business or financial losses.  Taking on a large debt.  Regular conflict with someone at home or at work.  Constant tiredness from lack of sleep. It is not just bad things that are stressful. It may be stressful to:  Win the lottery.  Get married.  Buy a new car. The amount of stress that can be easily tolerated varies from person to person. Changes generally cause stress, regardless of the types of change. Too much stress can affect your health. It may lead to physical or emotional problems. Too little stress (boredom) may also become stressful. SUGGESTIONS TO  REDUCE STRESS:  Talk things over with your family and friends. It often is helpful to share your concerns and worries. If you feel your problem is serious, you may want to get help from a professional counselor.  Consider your problems one at a time instead of lumping them all together. Trying to take care of everything at once may seem impossible. List all the things you need to do and then start with the most important one. Set a goal to accomplish 2 or 3 things each day. If you expect to do too many in a single day you will naturally fail, causing you to feel even more stressed.  Do not use alcohol or drugs to relieve stress. Although you may feel better for a short time, they do not remove the problems that caused the stress. They can also be habit forming.  Exercise regularly - at least 3 times per week. Physical exercise can help to relieve that "uptight" feeling and will relax you.  The shortest distance between despair and hope is often a good night's sleep.  Go to bed and get up on time allowing yourself time for appointments without being rushed.  Take a short "time-out" period from any stressful situation that occurs during the day. Close your eyes and take some deep breaths. Starting with the muscles in your face, tense them, hold it for a few seconds, then relax. Repeat this with the muscles in your neck, shoulders, hand, stomach, back and legs.  Take good care of yourself. Eat a balanced diet and get plenty of rest.  Schedule time for having fun. Take a break from your daily routine to relax. HOME CARE INSTRUCTIONS   Call if you feel overwhelmed by your problems and feel you can no longer manage them on your own.  Return immediately if you feel like hurting yourself or someone else. Document Released: 05/20/2001 Document Revised: 02/16/2012 Document Reviewed: 01/10/2008 ExitCare Patient Information 2013 ExitCare, LLC.   

## 2017-08-05 NOTE — Progress Notes (Signed)
21 y.o. G0P0 Single  Caucasian Fe here for annual exam. Periods monthly but usually 30-32 days, some cramping no medication use, with duration of 3-8 days. Heavy to light. Contraception working well. Aware of need for consistent condom use. No STD concerns or screening needed.  Senior at [employer] this year!  Patient's last menstrual period was 06/26/2017 (exact date).          Sexually active: Yes.    The current method of family planning is condoms sometimes.    Exercising: No.  exercise Smoker:  no  Health Maintenance: Pap:  none History of Abnormal Pap: no MMG:  none Self Breast exams: no Colonoscopy:  none BMD:   none TDaP:  2017 Shingles: no Pneumonia: no Hep C and HIV: both neg 2016 Labs: upt-neg   reports that she has been smoking Cigarettes.  She has never used smokeless tobacco. She reports that she drinks alcohol. She reports that she uses drugs, including Marijuana.  Past Medical History:  Diagnosis Date  . Bipolar 1 disorder, depressed (HCC) 01/2016  . Irregular menstrual cycle 06/19/2014  . 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 PTSD (post-traumatic stress disorder) 05/2016  . Substance abuse 05/08/2016   Went to Rehab on 05/08/16 for 2 months.     Past Surgical History:  Procedure Laterality Date  . MOUTH SURGERY  4 years ago     No current outpatient prescriptions on file.   No current facility-administered medications for this visit.     Family History  Problem Relation Age of Onset  . Prostate cancer Father   . Diabetes Mellitus II Father   . Hypertension Father   . Diabetes Mellitus II Paternal Grandmother   . Depression Paternal Grandmother   . Hypertension Paternal Grandmother   . Diabetes Mellitus II Paternal Grandfather   . Hypertension Paternal Grandfather   . Fibroids Maternal Grandmother   . Osteoporosis Maternal Grandmother     ROS:  Pertinent items are noted in HPI.  Otherwise, a comprehensive ROS was  negative.  Exam:   BP 90/60   Pulse 64   Resp 16   Ht 5' 4.75" (1.645 m)   Wt 112 lb (50.8 kg)   LMP 06/26/2017 (Exact Date)   BMI 18.78 kg/m  Height: 5' 4.75" (164.5 cm) Ht Readings from Last 3 Encounters:  08/05/17 5' 4.75" (1.645 m)  07/25/16 5\' 5"  (1.651 m)  10/07/15 5\' 4"  (1.626 m) (46 %, Z= -0.11)*   * Growth percentiles are based on CDC 2-20 Years data.    General appearance: alert, cooperative and appears stated age Head: Normocephalic, without obvious abnormality, atraumatic Neck: no adenopathy, supple, symmetrical, trachea midline and thyroid normal to inspection and palpation Lungs: clear to auscultation bilaterally Breasts: normal appearance, no masses or tenderness, No nipple retraction or dimpling, No nipple discharge or bleeding, No axillary or supraclavicular adenopathy Heart: regular rate and rhythm Abdomen: soft, non-tender; no masses,  no organomegaly Extremities: extremities normal, atraumatic, no cyanosis or edema Skin: Skin color, texture, turgor normal. No rashes or lesions Lymph nodes: Cervical, supraclavicular, and axillary nodes normal. No abnormal inguinal nodes palpated Neurologic: Grossly normal   Pelvic: External genitalia:  no lesions              Urethra:  normal appearing urethra with no masses, tenderness or lesions              Bartholin's and Skene's: normal  Vagina: normal appearing vagina with normal color and discharge, no lesions              Cervix: no bleeding following Pap, no cervical motion tenderness, no lesions and nulliparous appearance              Pap taken: Yes.   Bimanual Exam:  Uterus:  normal size, contour, position, consistency, mobility, non-tender              Adnexa: normal adnexa and no mass, fullness, tenderness               Rectovaginal: Confirms               Anus:  normal  appearance  Chaperone present: yes  A:  Well Woman with normal exam  Contraception condoms  P:   Reviewed health and  wellness pertinent to exam  Pap smear: yes   counseled on breast self exam, STD prevention, HIV risk factors and prevention, adequate intake of calcium and vitamin D, diet and exercise  return annually or prn  An After Visit Summary was printed and given to the patient.

## 2017-08-13 LAB — CYTOLOGY - PAP
Diagnosis: UNDETERMINED — AB
HPV: DETECTED — AB

## 2017-08-18 ENCOUNTER — Telehealth: Payer: Self-pay | Admitting: *Deleted

## 2017-08-18 NOTE — Telephone Encounter (Signed)
Notes recorded by Leda MinHamm, Willett Lefeber N, RN on 08/18/2017 at 10:55 AM EDT Left message to call Noreene LarssonJill at [phone].   ------  Notes recorded by Verner CholLeonard, Deborah S, CNM on 08/14/2017 at 7:46 AM EDT Notify patient that her pap smear showed ASCUS + HPV, due to age she will probably clear this spontaneously, She does not need further evaluation at this point. Just repeat pap smear in one year. 08 very important to have follow up

## 2017-08-18 NOTE — Telephone Encounter (Signed)
Patient returning your call.

## 2017-08-18 NOTE — Telephone Encounter (Signed)
Spoke with patient, advised of results and recommendations as seen below per Leota Sauerseborah Leonard, CNM. Brief explanation of HPV provided, questions answered. Patient declined to schedule next AEX at this time. 08 recall place. Patient verbalizes understanding and is agreeable.  Patient is agreeable to disposition. Will close encounter.

## 2018-12-23 ENCOUNTER — Telehealth: Payer: Self-pay

## 2018-12-23 NOTE — Telephone Encounter (Signed)
Left message for patient

## 2018-12-23 NOTE — Telephone Encounter (Signed)
Patient is in 08 recall. Patient is due for annual exam and pap smear. Please contact the patient to schedule.

## 2018-12-28 ENCOUNTER — Ambulatory Visit: Payer: 59 | Admitting: Certified Nurse Midwife

## 2018-12-28 ENCOUNTER — Encounter: Payer: Self-pay | Admitting: Certified Nurse Midwife

## 2018-12-28 ENCOUNTER — Other Ambulatory Visit (HOSPITAL_COMMUNITY)
Admission: RE | Admit: 2018-12-28 | Discharge: 2018-12-28 | Disposition: A | Discharge status: Home / Self Care | Payer: 59 | Source: Ambulatory Visit | Attending: Obstetrics & Gynecology | Admitting: Obstetrics & Gynecology

## 2018-12-28 ENCOUNTER — Other Ambulatory Visit: Payer: Self-pay

## 2018-12-28 VITALS — BP 114/70 | HR 64 | Resp 16 | Ht 64.25 in | Wt 111.0 lb

## 2018-12-28 DIAGNOSIS — Z124 Encounter for screening for malignant neoplasm of cervix: Secondary | ICD-10-CM | POA: Diagnosis not present

## 2018-12-28 DIAGNOSIS — Z8742 Personal history of other diseases of the female genital tract: Secondary | ICD-10-CM | POA: Diagnosis not present

## 2018-12-28 DIAGNOSIS — R6339 Other feeding difficulties: Secondary | ICD-10-CM

## 2018-12-28 DIAGNOSIS — Z01419 Encounter for gynecological examination (general) (routine) without abnormal findings: Secondary | ICD-10-CM | POA: Diagnosis not present

## 2018-12-28 DIAGNOSIS — R633 Feeding difficulties: Secondary | ICD-10-CM | POA: Diagnosis not present

## 2018-12-28 NOTE — Progress Notes (Signed)
23 y.o. G0P0 Single  Caucasian Fe here for annual exam. Periods normal, no issues. Contraception condoms working well.   No partner change, no STD screening needed. Has stopped drinking 12/08/2018 and plans to stop smoking 01/08/2019. 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 History of food aversion and having stomach discomfort with eating. No bulimia at this time or anorexia. No other health issues today.   Patient's last menstrual period was 12/22/2018 (exact date).          Sexually active: Yes.    The current method of family planning is condoms sometimes.    Exercising: No.  exercise Smoker:  no  Review of Systems  Constitutional: Negative.   HENT: Negative.   Eyes: Negative.   Respiratory: Negative.   Cardiovascular: Negative.   Gastrointestinal: Negative.   Genitourinary: Negative.   Musculoskeletal: Negative.   Skin: Negative.   Neurological: Negative.   Endo/Heme/Allergies: Negative.   Psychiatric/Behavioral: Negative.     Health Maintenance: Pap:  08-05-17 ASCUS HPV HR+ History of Abnormal Pap: yes MMG:  none Self Breast exams: yes Colonoscopy:  none BMD:   none TDaP:  2017 Shingles: no Pneumonia: no Hep C and HIV: both neg 2016 Labs: yes   reports that she has been smoking cigarettes. She has never used smokeless tobacco. She reports previous alcohol use. She reports current drug use. Drug: Marijuana.  Past Medical History:  Diagnosis Date  . Bipolar 1 disorder, depressed (HCC) 01/2016  . Irregular menstrual cycle 06/19/2014  . 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 PTSD (post-traumatic stress disorder) 05/2016  . Substance abuse (HCC) 05/08/2016   Went to Rehab on 05/08/16 for 2 months.     Past Surgical History:  Procedure Laterality Date  . MOUTH SURGERY  4 years ago     No current outpatient medications on file.   No current facility-administered medications for this visit.     Family History  Problem Relation Age of Onset   . Prostate cancer Father   . Diabetes Mellitus II Father   . Hypertension Father   . Diabetes Mellitus II Paternal Grandmother   . Depression Paternal Grandmother   . Hypertension Paternal Grandmother   . Diabetes Mellitus II Paternal Grandfather   . Hypertension Paternal Grandfather   . Fibroids Maternal Grandmother   . Osteoporosis Maternal Grandmother     ROS:  Pertinent items are noted in HPI.  Otherwise, a comprehensive ROS was negative.  Exam:   BP 114/70   Pulse 64   Resp 16   Ht 5' 4.25" (1.632 m)   Wt 111 lb (50.3 kg)   LMP 12/22/2018 (Exact Date)   BMI 18.91 kg/m  Height: 5' 4.25" (163.2 cm) Ht Readings from Last 3 Encounters:  12/28/18 5' 4.25" (1.632 m)  08/05/17 5' 4.75" (1.645 m)  07/25/16 5\' 5"  (1.651 m)    General appearance: alert, cooperative and appears stated age Head: Normocephalic, without obvious abnormality, atraumatic Neck: no adenopathy, supple, symmetrical, trachea midline and thyroid normal to inspection and palpation Lungs: clear to auscultation bilaterally Breasts: normal appearance, no masses or tenderness, No nipple retraction or dimpling, No nipple discharge or bleeding, No axillary or supraclavicular adenopathy Heart: regular rate and rhythm Abdomen: soft, non-tender; no masses,  no organomegaly Extremities: extremities normal, atraumatic, no cyanosis or edema Skin: Skin color, texture, turgor normal. No rashes or lesions Lymph nodes: Cervical, supraclavicular, and axillary nodes normal. No abnormal inguinal nodes palpated Neurologic: Grossly normal   Pelvic:  External genitalia:  no lesions              Urethra:  normal appearing urethra with no masses, tenderness or lesions              Bartholin's and Skene's: normal                 Vagina: normal appearing vagina with normal color and discharge, no lesions              Cervix: no cervical motion tenderness, no lesions and nulliparous appearance              Pap taken: Yes.    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  Encouraged to follow through with cessation.  Aware of resources if needed  Discussed eating and then consuming small amount of [place] Yogurt afterward to see if this will resolve issue. Patient will advise.  Pap smear: yes   counseled on breast self exam, STD prevention, HIV risk factors and prevention, feminine hygiene, adequate intake of calcium and vitamin D, diet and exercise  return annually or prn  An After Visit Summary was printed and given to the patient.

## 2018-12-28 NOTE — Patient Instructions (Signed)
General topics  Next pap or exam is  due in 1 year Take a Women's multivitamin Take 1200 mg. of calcium daily - prefer dietary If any concerns in interim to call back  Breast Self-Awareness Practicing breast self-awareness may pick up problems early, prevent significant medical complications, and possibly save your life. By practicing breast self-awareness, you can become familiar with how your breasts look and feel and if your breasts are changing. This allows you to notice changes early. It can also offer you some reassurance that your breast health is good. One way to learn what is normal for your breasts and whether your breasts are changing is to do a breast self-exam. If you find a lump or something that was not present in the past, it is best to contact your caregiver right away. Other findings that should be evaluated by your caregiver include nipple discharge, especially if it is bloody; skin changes or reddening; areas where the skin seems to be pulled in (retracted); or new lumps and bumps. Breast pain is seldom associated with cancer (malignancy), but should also be evaluated by a caregiver. BREAST SELF-EXAM The best time to examine your breasts is 5 7 days after your menstrual period is over.  ExitCare Patient Information 2013 [employer].   Exercise to Stay Healthy Exercise helps you become and stay healthy. EXERCISE IDEAS AND TIPS Choose exercises that:  You enjoy.  Fit into your day. You do not need to exercise really hard to be healthy. You can do exercises at a slow or medium level and stay healthy. You can:  Stretch before and after working out.  Try yoga, Pilates, or tai chi.  Lift weights.  Walk fast, swim, jog, run, climb stairs, bicycle, dance, or rollerskate.  Take aerobic classes. Exercises that burn about 150 calories:  Running 1  miles in 15 minutes.  Playing volleyball for 45 to 60 minutes.  Washing and waxing a car for 45 to 60  minutes.  Playing touch football for 45 minutes.  Walking 1  miles in 35 minutes.  Pushing a stroller 1  miles in 30 minutes.  Playing basketball for 30 minutes.  Raking leaves for 30 minutes.  Bicycling 5 miles in 30 minutes.  Walking 2 miles in 30 minutes.  Dancing for 30 minutes.  Shoveling snow for 15 minutes.  Swimming laps for 20 minutes.  Walking up stairs for 15 minutes.  Bicycling 4 miles in 15 minutes.  Gardening for 30 to 45 minutes.  Jumping rope for 15 minutes.  Washing windows or floors for 45 to 60 minutes. Document Released: 12/27/2010 Document Revised: 02/16/2012 Document Reviewed: 12/27/2010 ExitCare Patient Information 2013 ExitCare[employer].   Other topics ( that may be useful information):    Sexually Transmitted Disease Sexually transmitted disease (STD) refers to any infection that is passed from person to person during sexual activity. This may happen by way of saliva, semen, blood, vaginal mucus, or urine. Common STDs include:  Gonorrhea.  Chlamydia.  Syphilis.  HIV/AIDS.  Genital herpes.  Hepatitis B and C.  Trichomonas.  Human papillomavirus (HPV).  Pubic lice. CAUSES  An STD may be spread by bacteria, virus, or parasite. A person can get an STD by:  Sexual intercourse with an infected person.  Sharing sex toys with an infected person.  Sharing needles with an infected person.  Having intimate contact with the genitals, mouth, or rectal areas of an infected person. SYMPTOMS  Some people may not have any symptoms, but   they can still pass the infection to others. Different STDs have different symptoms. Symptoms include:  Painful or bloody urination.  Pain in the pelvis, abdomen, vagina, anus, throat, or eyes.  Skin rash, itching, irritation, growths, or sores (lesions). These usually occur in the genital or anal area.  Abnormal vaginal discharge.  Penile discharge in men.  Soft, flesh-colored skin growths in the  genital or anal area.  Fever.  Pain or bleeding during sexual intercourse.  Swollen glands in the groin area.  Yellow skin and eyes (jaundice). This is seen with hepatitis. DIAGNOSIS  To make a diagnosis, your caregiver may:  Take a medical history.  Perform a physical exam.  Take a specimen (culture) to be examined.  Examine a sample of discharge under a microscope.  Perform blood test TREATMENT   Chlamydia, gonorrhea, trichomonas, and syphilis can be cured with antibiotic medicine.  Genital herpes, hepatitis, and HIV can be treated, but not cured, with prescribed medicines. The medicines will lessen the symptoms.  Genital warts from HPV can be treated with medicine or by freezing, burning (electrocautery), or surgery. Warts may come back.  HPV is a virus and cannot be cured with medicine or surgery.However, abnormal areas may be followed very closely by your caregiver and may be removed from the cervix, vagina, or vulva through office procedures or surgery. If your diagnosis is confirmed, your recent sexual partners need treatment. This is true even if they are symptom-free or have a negative culture or evaluation. They should not have sex until their caregiver says it is okay. HOME CARE INSTRUCTIONS  All sexual partners should be informed, tested, and treated for all STDs.  Take your antibiotics as directed. Finish them even if you start to feel better.  Only take over-the-counter or prescription medicines for pain, discomfort, or fever as directed by your caregiver.  Rest.  Eat a balanced diet and drink enough fluids to keep your urine clear or pale yellow.  Do not have sex until treatment is completed and you have followed up with your caregiver. STDs should be checked after treatment.  Keep all follow-up appointments, Pap tests, and blood tests as directed by your caregiver.  Only use latex condoms and water-soluble lubricants during sexual activity. Do not use  petroleum jelly or oils.  Avoid alcohol and illegal drugs.  Get vaccinated for HPV and hepatitis. If you have not received these vaccines in the past, talk to your caregiver about whether one or both might be right for you.  Avoid risky sex practices that can break the skin. The only way to avoid getting an STD is to avoid all sexual activity.Latex condoms and dental dams (for oral sex) will help lessen the risk of getting an STD, but will not completely eliminate the risk. SEEK MEDICAL CARE IF:   You have a fever.  You have any new or worsening symptoms. Document Released: 02/14/2003 Document Revised: 02/16/2012 Document Reviewed: 02/21/2011 [facility] Patient Information 2013 [place].    Domestic Abuse You are being battered or abused if someone close to you hits, pushes, or physically hurts you in any way. You also are being abused if you are forced into activities. You are being sexually abused if you are forced to have sexual contact of any kind. You are being emotionally abused if you are made to feel worthless or if you are constantly threatened. It is important to remember that help is available. No one has the right to abuse you. PREVENTION OF FURTHER  ABUSE  Learn the warning signs of danger. This varies with situations but may include: the use of alcohol, threats, isolation from friends and family, or forced sexual contact. Leave if you feel that violence is going to occur.  If you are attacked or beaten, report it to the police so the abuse is documented. You do not have to press charges. The police can protect you while you or the attackers are leaving. Get the officer's name and badge number and a copy of the report.  Find someone you can trust and tell them what is happening to you: your caregiver, a nurse, clergy member, close friend or family member. Feeling ashamed is natural, but remember that you have done nothing wrong. No one deserves abuse. Document Released:  11/21/2000 Document Revised: 02/16/2012 Document Reviewed: 01/30/2011 ExitCare Patient Information 2013 ExitCare[employer].    How Much is Too Much Alcohol? Drinking too much alcohol can cause injury, accidents, and health problems. These types of problems can include:   Car crashes.  Falls.  Family fighting (domestic violence).  Drowning.  Fights.  Injuries.  Burns.  Damage to certain organs.  Having a baby with birth defects. ONE DRINK CAN BE TOO MUCH WHEN YOU ARE:  Working.  Pregnant or breastfeeding.  Taking medicines. Ask your doctor.  Driving or planning to drive. If you or someone you know has a drinking problem, get help from a doctor.  Document Released: 09/20/2009 Document Revised: 02/16/2012 Document Reviewed: 09/20/2009 ExitCare Patient Information 2013 ExitCare[employer].   Smoking Hazards Smoking cigarettes is extremely bad for your health. Tobacco smoke has over 200 known poisons in it. There are over 60 chemicals in tobacco smoke that cause cancer. Some of the chemicals found in cigarette smoke include:   Cyanide.  Benzene.  Formaldehyde.  Methanol (wood alcohol).  Acetylene (fuel used in welding torches).  Ammonia. Cigarette smoke also contains the poisonous gases nitrogen oxide and carbon monoxide.  Cigarette smokers have an increased risk of many serious medical problems and Smoking causes approximately:  90% of all lung cancer deaths in men.  80% of all lung cancer deaths in women.  90% of deaths from chronic obstructive lung disease. Compared with nonsmokers, smoking increases the risk of:  Coronary heart disease by 2 to 4 times.  Stroke by 2 to 4 times.  Men developing lung cancer by 23 times.  Women developing lung cancer by 13 times.  Dying from chronic obstructive lung diseases by 12 times.  . Smoking is the most preventable cause of death and disease in our society.  WHY IS SMOKING ADDICTIVE?  Nicotine is the chemical  agent in tobacco that is capable of causing addiction or dependence.  When you smoke and inhale, nicotine is absorbed rapidly into the bloodstream through your lungs. Nicotine absorbed through the lungs is capable of creating a powerful addiction. Both inhaled and non-inhaled nicotine may be addictive.  Addiction studies of cigarettes and spit tobacco show that addiction to nicotine occurs mainly during the teen years, when young people begin using tobacco products. WHAT ARE THE BENEFITS OF QUITTING?  There are many health benefits to quitting smoking.   Likelihood of developing cancer and heart disease decreases. Health improvements are seen almost immediately.  Blood pressure, pulse rate, and breathing patterns start returning to normal soon after quitting. QUITTING SMOKING   American Lung Association - 1-800-LUNGUSA  American Cancer Society - 1-800-ACS-2345 Document Released: 01/01/2005 Document Revised: 02/16/2012 Document Reviewed: 09/05/2009 ExitCare Patient Information 2013 ExitCare,   LLC.   Stress Management Stress is a state of physical or mental tension that often results from changes in your life or normal routine. Some common causes of stress are:  Death of a loved one.  Injuries or severe illnesses.  Getting fired or changing jobs.  Moving into a new home. Other causes may be:  Sexual problems.  Business or financial losses.  Taking on a large debt.  Regular conflict with someone at home or at work.  Constant tiredness from lack of sleep. It is not just bad things that are stressful. It may be stressful to:  Win the lottery.  Get married.  Buy a new car. The amount of stress that can be easily tolerated varies from person to person. Changes generally cause stress, regardless of the types of change. Too much stress can affect your health. It may lead to physical or emotional problems. Too little stress (boredom) may also become stressful. SUGGESTIONS TO  REDUCE STRESS:  Talk things over with your family and friends. It often is helpful to share your concerns and worries. If you feel your problem is serious, you may want to get help from a professional counselor.  Consider your problems one at a time instead of lumping them all together. Trying to take care of everything at once may seem impossible. List all the things you need to do and then start with the most important one. Set a goal to accomplish 2 or 3 things each day. If you expect to do too many in a single day you will naturally fail, causing you to feel even more stressed.  Do not use alcohol or drugs to relieve stress. Although you may feel better for a short time, they do not remove the problems that caused the stress. They can also be habit forming.  Exercise regularly - at least 3 times per week. Physical exercise can help to relieve that "uptight" feeling and will relax you.  The shortest distance between despair and hope is often a good night's sleep.  Go to bed and get up on time allowing yourself time for appointments without being rushed.  Take a short "time-out" period from any stressful situation that occurs during the day. Close your eyes and take some deep breaths. Starting with the muscles in your face, tense them, hold it for a few seconds, then relax. Repeat this with the muscles in your neck, shoulders, hand, stomach, back and legs.  Take good care of yourself. Eat a balanced diet and get plenty of rest.  Schedule time for having fun. Take a break from your daily routine to relax. HOME CARE INSTRUCTIONS   Call if you feel overwhelmed by your problems and feel you can no longer manage them on your own.  Return immediately if you feel like hurting yourself or someone else. Document Released: 05/20/2001 Document Revised: 02/16/2012 Document Reviewed: 01/10/2008 ExitCare Patient Information 2013 ExitCare, LLC.   

## 2018-12-28 NOTE — Telephone Encounter (Signed)
Recall completed. Encounter closed. 

## 2018-12-28 NOTE — Telephone Encounter (Signed)
aex is 12/28/2018 with [name] [name], routed to [place], RN

## 2019-01-05 LAB — CYTOLOGY - PAP: Diagnosis: NEGATIVE

## 2019-10-01 ENCOUNTER — Other Ambulatory Visit: Payer: Self-pay

## 2019-10-01 DIAGNOSIS — Z20822 Contact with and (suspected) exposure to covid-19: Secondary | ICD-10-CM

## 2019-10-02 LAB — NOVEL CORONAVIRUS, NAA: SARS-CoV-2, NAA: NOT DETECTED

## 2020-02-14 ENCOUNTER — Telehealth: Payer: Self-pay | Admitting: Certified Nurse Midwife

## 2020-02-14 NOTE — Telephone Encounter (Signed)
Spoke to pt. Pt states had positive pregnancy at home today. Pt states LMP 01/06/2020. Pt is approx 5 weeks 4 days.  Pt requests OV  for blood work and pregnancy consult. Pt scheduled for OV with Debbi, CNM on 02/17/2020 at 1130 am. Pt agreeable and verbalized understanding. CPS neg.   Routing to Debbi, CNM for review and will close encounter.

## 2020-02-14 NOTE — Telephone Encounter (Signed)
Patient had a positive otc pregnancy test. She would like to come in for blood work.

## 2020-02-17 ENCOUNTER — Encounter: Payer: Self-pay | Admitting: Certified Nurse Midwife

## 2020-02-17 ENCOUNTER — Other Ambulatory Visit: Payer: Self-pay

## 2020-02-17 ENCOUNTER — Ambulatory Visit: Payer: 59 | Admitting: Certified Nurse Midwife

## 2020-02-17 VITALS — BP 110/70 | HR 64 | Temp 98.4°F | Resp 16 | Wt 116.0 lb

## 2020-02-17 DIAGNOSIS — Z3201 Encounter for pregnancy test, result positive: Secondary | ICD-10-CM | POA: Diagnosis not present

## 2020-02-17 DIAGNOSIS — N912 Amenorrhea, unspecified: Secondary | ICD-10-CM

## 2020-02-17 LAB — POCT URINE PREGNANCY: Preg Test, Ur: POSITIVE — AB

## 2020-02-17 NOTE — Patient Instructions (Signed)
Preparing for Pregnancy °If you are considering becoming pregnant, make an appointment to see your regular health care provider to learn how to prepare for a safe and healthy pregnancy (preconception care). During a preconception care visit, your health care provider will: °· Do a complete physical exam, including a Pap test. °· Take a complete medical history. °· Give you information, answer your questions, and help you resolve problems. °Preconception checklist °Medical history °· Tell your health care provider about any current or past medical conditions. Your pregnancy or your ability to become pregnant may be affected by chronic conditions, such as diabetes, chronic hypertension, and thyroid problems. °· Include your family's medical history as well as your partner's medical history. °· Tell your health care provider about any history of STIs (sexually transmitted infections). These can affect your pregnancy. In some cases, they can be passed to your baby. Discuss any concerns that you have about STIs. °· If indicated, discuss the benefits of genetic testing. This testing will show whether there are any genetic conditions that may be passed from you or your partner to your baby. °· Tell your health care provider about: °? Any problems you have had with conception or pregnancy. °? Any medicines you take. These include vitamins, herbal supplements, and over-the-counter medicines. °? Your history of immunizations. Discuss any vaccinations that you may need. °Diet °· Ask your health care provider what to include in a healthy diet that has a balance of nutrients. This is especially important when you are pregnant or preparing to become pregnant. °· Ask your health care provider to help you reach a healthy weight before pregnancy. °? If you are overweight, you may be at higher risk for certain complications, such as high blood pressure, diabetes, and preterm birth. °? If you are underweight, you are more likely to  have a baby who has a low birth weight. °Lifestyle, work, and home °· Let your health care provider know: °? About any lifestyle habits that you have, such as alcohol use, drug use, or smoking. °? About recreational activities that may put you at risk during pregnancy, such as downhill skiing and certain exercise programs. °? Tell your health care provider about any international travel, especially any travel to places with an active Zika virus outbreak. °? About harmful substances that you may be exposed to at work or at home. These include chemicals, pesticides, radiation, or even litter boxes. °? If you do not feel safe at home. °Mental health °· Tell your health care provider about: °? Any history of mental health conditions, including feelings of depression, sadness, or anxiety. °? Any medicines that you take for a mental health condition. These include herbs and supplements. °Home instructions to prepare for pregnancy °Lifestyle ° °· Eat a balanced diet. This includes fresh fruits and vegetables, whole grains, lean meats, low-fat dairy products, healthy fats, and foods that are high in fiber. Ask to meet with a nutritionist or registered dietitian for assistance with meal planning and goals. °· Get regular exercise. Try to be active for at least 30 minutes a day on most days of the week. Ask your health care provider which activities are safe during pregnancy. °· Do not use any products that contain nicotine or tobacco, such as cigarettes and e-cigarettes. If you need help quitting, ask your health care provider. °· Do not drink alcohol. °· Do not take illegal drugs. °· Maintain a healthy weight. Ask your health care provider what weight range is right for you. °General   instructions °· Keep an accurate record of your menstrual periods. This makes it easier for your health care provider to determine your baby's due date. °· Begin taking prenatal vitamins and folic acid supplements daily as directed by your  health care provider. °· Manage any chronic conditions, such as high blood pressure and diabetes, as told by your health care provider. This is important. °How do I know that I am pregnant? °You may be pregnant if you have been sexually active and you miss your period. Symptoms of early pregnancy include: °· Mild cramping. °· Very light vaginal bleeding (spotting). °· Feeling unusually tired. °· Nausea and vomiting (morning sickness). °If you have any of these symptoms and you suspect that you might be pregnant, you can take a home pregnancy test. These tests check for a hormone in your urine (human chorionic gonadotropin, or hCG). A woman's body begins to make this hormone during early pregnancy. These tests are very accurate. Wait until at least the first day after you miss your period to take one. If the test shows that you are pregnant (you get a positive result), call your health care provider to make an appointment for prenatal care. °What should I do if I become pregnant? ° °  ° °· Make an appointment with your health care provider as soon as you suspect you are pregnant. °· Do not use any products that contain nicotine, such as cigarettes, chewing tobacco, and e-cigarettes. If you need help quitting, ask your health care provider. °· Do not drink alcoholic beverages. Alcohol is related to a number of birth defects. °· Avoid toxic odors and chemicals. °· You may continue to have sexual intercourse if it does not cause pain or other problems, such as vaginal bleeding. °This information is not intended to replace advice given to you by your health care provider. Make sure you discuss any questions you have with your health care provider. °Document Revised: 11/26/2017 Document Reviewed: 06/15/2016 °Elsevier Patient Education © 2020 [employer] ° °

## 2020-02-17 NOTE — Progress Notes (Signed)
24 y.o. single white female g0p0 presents with amenorrhea with + UPT on 02/14/2020. LMP 01/06/20. Not planned pregnancy. Complaining of breast tenderness, fatigue. Occasional food aversion, but no nausea. Denies spotting, bleeding or cramping, since positive pregnancy test. Medications she is taking are: none. Eating small frequent meals now, which she is tolerating well.  Patient has not consumed alcohol since + UPT. Partner aware of pregnancy, but not supportive as of yet. He has a child already. Patient feels this will change. Patient recent job loss and parents insurance at present. No other health issues today.  Review of Systems  Constitutional: Negative.   HENT: Negative.   Eyes: Negative.   Respiratory: Negative.   Gastrointestinal: Negative.   Genitourinary: Negative.   Musculoskeletal: Negative.   Skin: Negative.   Neurological: Negative.   Endo/Heme/Allergies: Negative.   Psychiatric/Behavioral: Negative.     O: HPI pertinent to above. Healthy WDWN female Affect: normal, orientation x 3  Last Aex: 12/28/2018 Pap smear:  12/28/2018 negative            Rubella screen: not done  A: Amenorrhea with positive UPT  6 wk 0 days per LNMP with [facility] 10/12/2020. Planned/Unplanned pregnancy   P: Reviewed with patient importance of prenatal care during pregnancy. Given OB provider list. Reviewed nutrition importance of pregnancy and selecting from all food groups and making sure to have adequate protein intake daily. Discussed avoiding raw or exotic fish, soft cheeses due to risk of bacteria . Discussed concerns with FAS with alcohol use in pregnancy. Discussed increase of IUGR and SIDS with smoking use or second smoke. Reviewed warning signs of early pregnancy and need to advise if occurs. Discussed comfort measures for early pregnancy changes. Offered viability PUS here prior to initiating prenatal care. Patient will advise if she plans to have PUS. She will be called with insurance information  and scheduled. Discussed applying for pregnancy Medicaid to help with prenatal care. She is aware of the program she has worked in this area.  Questions addressed at length.   Rv prn   30 minutes in time spent with patient in face to face counseling regarding pregnancy and prenatal care.

## 2020-02-20 ENCOUNTER — Encounter: Payer: Self-pay | Admitting: Certified Nurse Midwife

## 2020-02-21 ENCOUNTER — Telehealth: Payer: Self-pay

## 2020-02-21 DIAGNOSIS — Z3201 Encounter for pregnancy test, result positive: Secondary | ICD-10-CM

## 2020-02-21 DIAGNOSIS — N912 Amenorrhea, unspecified: Secondary | ICD-10-CM

## 2020-02-21 NOTE — Telephone Encounter (Signed)
Patient called wanting to schedule a vaginal ultrasound. Patient stated they had a pregnancy confirmation here on 02/17/20.

## 2020-02-21 NOTE — Telephone Encounter (Signed)
Spoke with patient.   1. Patient calling to schedule PUS for viability, discussed at OV on 02/17/20 with Leota Sauers, CNM. PUS scheduled for 3/23 at 2:30pm, consult to follow with Dr. Oscar La. Order placed for precert.   2. Patient reports mild cramping and pressure on her left side just under hip bone, started 1-2 wks ago. Patient states she mentioned this during her OV, did not discuss with Leota Sauers, CNM. Patient asking oif she could use heat or ice for comfort.  Denies vaginal bleeding, N/V, severe pain, urinary symptoms. Advised patient I will review concerns with Leota Sauers, CNM and return call. Patient agreeable.   Leota Sauers, CNM -please review and advise.   Cc: Dr. Oscar La, [facility] [place], [place]

## 2020-02-21 NOTE — Telephone Encounter (Signed)
Spoke with patient, advised per Deborah Leonard, CNM. Patient verbalizes understanding and is agreeable.   Encounter closed.  

## 2020-02-21 NOTE — Telephone Encounter (Signed)
She should advise if cramping increases or bleeding.

## 2020-02-21 NOTE — Telephone Encounter (Signed)
Would suggest she try warm tub bath to see if this resolves. Any constipation? This can also cause minimal cramping. Did not see any history of chlamydia on chart for ectopic concerns. She should advise if cramping increases or bleeding.

## 2020-02-22 ENCOUNTER — Encounter: Payer: Self-pay | Admitting: Certified Nurse Midwife

## 2020-02-28 ENCOUNTER — Ambulatory Visit (INDEPENDENT_AMBULATORY_CARE_PROVIDER_SITE_OTHER): Payer: 59 | Admitting: Obstetrics and Gynecology

## 2020-02-28 ENCOUNTER — Ambulatory Visit (INDEPENDENT_AMBULATORY_CARE_PROVIDER_SITE_OTHER): Payer: 59

## 2020-02-28 ENCOUNTER — Other Ambulatory Visit: Payer: Self-pay

## 2020-02-28 ENCOUNTER — Encounter: Payer: Self-pay | Admitting: Obstetrics and Gynecology

## 2020-02-28 VITALS — BP 122/66 | HR 81 | Temp 99.0°F | Ht 65.0 in | Wt 119.2 lb

## 2020-02-28 DIAGNOSIS — K5900 Constipation, unspecified: Secondary | ICD-10-CM

## 2020-02-28 DIAGNOSIS — N912 Amenorrhea, unspecified: Secondary | ICD-10-CM | POA: Diagnosis not present

## 2020-02-28 DIAGNOSIS — Z3201 Encounter for pregnancy test, result positive: Secondary | ICD-10-CM | POA: Diagnosis not present

## 2020-02-28 DIAGNOSIS — R102 Pelvic and perineal pain: Secondary | ICD-10-CM | POA: Diagnosis not present

## 2020-02-28 DIAGNOSIS — Z3491 Encounter for supervision of normal pregnancy, unspecified, first trimester: Secondary | ICD-10-CM

## 2020-02-28 NOTE — Patient Instructions (Signed)
Common Medications Safe in Pregnancy  Acne:      Constipation:  Benzoyl Peroxide     Colace  Clindamycin      Dulcolax Suppository  Topica Erythromycin     Fibercon  Salicylic Acid      Metamucil         Miralax AVOID:        Senakot   Accutane    Cough:  Retin-A       Cough Drops  Tetracycline      Phenergan w/ Codeine if Rx  Minocycline      Robitussin (Plain & DM)  Antibiotics:     Crabs/Lice:  Ceclor       RID  Cephalosporins    AVOID:  E-Mycins      Kwell  Keflex  Macrobid/Macrodantin   Diarrhea:  Penicillin      Kao-Pectate  Zithromax      Imodium AD         PUSH FLUIDS AVOID:       Cipro     Fever:  Tetracycline      Tylenol (Regular or Extra  Minocycline       Strength)  Levaquin      Extra Strength-Do not          Exceed 8 tabs/24 hrs Caffeine:        <200mg/day (equiv. To 1 cup of coffee or  approx. 3 12 oz sodas)         Gas: Cold/Hayfever:       Gas-X  Benadryl      Mylicon  Claritin       Phazyme  **Claritin-D        Chlor-Trimeton    Headaches:  Dimetapp      ASA-Free Excedrin  Drixoral-Non-Drowsy     Cold Compress  Mucinex (Guaifenasin)     Tylenol (Regular or Extra  Sudafed/Sudafed-12 Hour     Strength)  **Sudafed PE Pseudoephedrine   Tylenol Cold & Sinus     Vicks Vapor Rub  Zyrtec  **AVOID if Problems With Blood Pressure         Heartburn: Avoid lying down for at least 1 hour after meals  Aciphex      Maalox     Rash:  Milk of Magnesia     Benadryl    Mylanta       1% Hydrocortisone Cream  Pepcid  Pepcid Complete   Sleep Aids:  Prevacid      Ambien   Prilosec       Benadryl  Rolaids       Chamomile Tea  Tums (Limit 4/day)     Unisom  Zantac       Tylenol PM         Warm milk-add vanilla or  Hemorrhoids:       Sugar for taste  Anusol/Anusol H.C.  (RX: Analapram 2.5%)  Sugar Substitutes:  Hydrocortisone OTC     Ok in moderation  Preparation H      Tucks        Vaseline lotion applied to tissue with  wiping    Herpes:     Throat:  Acyclovir      Oragel  Famvir  Valtrex     Vaccines:         Flu Shot Leg Cramps:       *Gardasil  Benadryl      Hepatitis A         Hepatitis B Nasal Spray:         Pneumovax  Saline Nasal Spray     Polio Booster         Tetanus Nausea:       Tuberculosis test or PPD  Vitamin B6 25 mg TID   AVOID:    Dramamine      *Gardasil  Emetrol       Live Poliovirus  Ginger Root 250 mg QID    MMR (measles, mumps &  High Complex Carbs @ Bedtime    rebella)  Sea Bands-Accupressure    Varicella (Chickenpox)  Unisom 1/2 tab TID     *No known complications           If received before Pain:         Known pregnancy;   Darvocet       Resume series after  Lortab        Delivery  Percocet    Yeast:   Tramadol      Femstat  Tylenol 3      Gyne-lotrimin  Ultram       Monistat  Vicodin           MISC:         All Sunscreens           Hair Coloring/highlights          Insect Repellant's          (Including DEET)         Mystic Tans Commonly Asked Questions During Pregnancy  Cats: A parasite can be excreted in cat feces.  To avoid exposure you need to have another person empty the little box.  If you must empty the litter box you will need to wear gloves.  Wash your hands after handling your cat.  This parasite can also be found in raw or undercooked meat so this should also be avoided.  Colds, Sore Throats, Flu: Please check your medication sheet to see what you can take for symptoms.  If your symptoms are unrelieved by these medications please call the office.  Dental Work: Most any dental work your dentist recommends is permitted.  X-rays should only be taken during the first trimester if absolutely necessary.  Your abdomen should be shielded with a lead apron during all x-rays.  Please notify your provider prior to receiving any x-rays.  Novocaine is fine; gas is not recommended.  If your dentist requires a note from us prior to dental work please call the office  and we will provide one for you.  Exercise: Exercise is an important part of staying healthy during your pregnancy.  You may continue most exercises you were accustomed to prior to pregnancy.  Later in your pregnancy you will most likely notice you have difficulty with activities requiring balance like riding a bicycle.  It is important that you listen to your body and avoid activities that put you at a higher risk of falling.  Adequate rest and staying well hydrated are a must!  If you have questions about the safety of specific activities ask your provider.    Exposure to Children with illness: Try to avoid obvious exposure; report any symptoms to us when noted,  If you have chicken pos, red measles or mumps, you should be immune to these diseases.   Please do not take any vaccines while pregnant unless you have checked with your OB provider.  Fetal Movement: After 28 weeks we recommend you do "kick counts" twice daily.  Lie or sit down in a calm   quiet environment and count your baby movements "kicks".  You should feel your baby at least 10 times per hour.  If you have not felt 10 kicks within the first hour get up, walk around and have something sweet to eat or drink then repeat for an additional hour.  If count remains less than 10 per hour notify your provider.  Fumigating: Follow your pest control agent's advice as to how long to stay out of your home.  Ventilate the area well before re-entering.  Hemorrhoids:   Most over-the-counter preparations can be used during pregnancy.  Check your medication to see what is safe to use.  It is important to use a stool softener or fiber in your diet and to drink lots of liquids.  If hemorrhoids seem to be getting worse please call the office.   Hot Tubs:  Hot tubs Jacuzzis and saunas are not recommended while pregnant.  These increase your internal body temperature and should be avoided.  Intercourse:  Sexual intercourse is safe during pregnancy as long as  you are comfortable, unless otherwise advised by your provider.  Spotting may occur after intercourse; report any bright red bleeding that is heavier than spotting.  Labor:  If you know that you are in labor, please go to the hospital.  If you are unsure, please call the office and let us help you decide what to do.  Lifting, straining, etc:  If your job requires heavy lifting or straining please check with your provider for any limitations.  Generally, you should not lift items heavier than that you can lift simply with your hands and arms (no back muscles)  Painting:  Paint fumes do not harm your pregnancy, but may make you ill and should be avoided if possible.  Latex or water based paints have less odor than oils.  Use adequate ventilation while painting.  Permanents & Hair Color:  Chemicals in hair dyes are not recommended as they cause increase hair dryness which can increase hair loss during pregnancy.  " Highlighting" and permanents are allowed.  Dye may be absorbed differently and permanents may not hold as well during pregnancy.  Sunbathing:  Use a sunscreen, as skin burns easily during pregnancy.  Drink plenty of fluids; avoid over heating.  Tanning Beds:  Because their possible side effects are still unknown, tanning beds are not recommended.  Ultrasound Scans:  Routine ultrasounds are performed at approximately 20 weeks.  You will be able to see your baby's general anatomy an if you would like to know the gender this can usually be determined as well.  If it is questionable when you conceived you may also receive an ultrasound early in your pregnancy for dating purposes.  Otherwise ultrasound exams are not routinely performed unless there is a medical necessity.  Although you can request a scan we ask that you pay for it when conducted because insurance does not cover " patient request" scans.  Work: If your pregnancy proceeds without complications you may work until your due date,  unless your physician or employer advises otherwise.  Round Ligament Pain/Pelvic Discomfort:  Sharp, shooting pains not associated with bleeding are fairly common, usually occurring in the second trimester of pregnancy.  They tend to be worse when standing up or when you remain standing for long periods of time.  These are the result of pressure of certain pelvic ligaments called "round ligaments".  Rest, Tylenol and heat seem to be the most effective relief.  As the   womb and fetus grow, they rise out of the pelvis and the discomfort improves.  Please notify the office if your pain seems different than that described.  It may represent a more serious condition.   

## 2020-02-28 NOTE — Progress Notes (Signed)
GYNECOLOGY  VISIT   HPI: 24 y.o.   Single White or Caucasian Not Hispanic or Latino  female   G1P0 with Patient's last menstrual period was 01/06/2020 (exact date).   here for  Viability ultrasound consult.   She c/o vaginal discharge, mucous, no itching, burning, irritation or odor. No bleeding. She has some intermittent cramping, more on the left, mild. She has been constipated lately.   GYNECOLOGIC HISTORY: Patient's last menstrual period was 01/06/2020 (exact date). Contraception:none  Menopausal hormone therapy: none         OB History    Gravida  1   Para      Term      Preterm      AB      Living        SAB      TAB      Ectopic      Multiple      Live Births                 Patient Active Problem List   Diagnosis Date Noted  . Borderline personality disorder (HCC) 02/18/2017  . PTSD (post-traumatic stress disorder) 02/15/2017  . Cannabis use disorder, severe, dependence (HCC) 02/15/2017  . Suicidal ideations   . 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 Adjustment disorder with anxious mood 10/14/2015  . Irregular menstrual cycle 06/19/2014    Past Medical History:  Diagnosis Date  . Abnormal Pap smear of cervix    08-05-17 ASCUS HPV HR+, no procedure due to age  . Bipolar 1 disorder, depressed (HCC) 01/2016  . Irregular menstrual cycle 06/19/2014  . 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 PTSD (post-traumatic stress disorder) 05/2016  . PTSD (post-traumatic stress disorder)   . Substance abuse (HCC) 05/08/2016   Went to Rehab on 05/08/16 for 2 months.     Past Surgical History:  Procedure Laterality Date  . MOUTH SURGERY  4 years ago     No current outpatient medications on file.   No current facility-administered medications for this visit.     ALLERGIES: Amoxicillin  Family History  Problem Relation Age of Onset  . Prostate cancer Father   . Diabetes Mellitus II Father   . Hypertension Father    . Diabetes Mellitus II Paternal Grandmother   . Depression Paternal Grandmother   . Hypertension Paternal Grandmother   . Diabetes Mellitus II Paternal Grandfather   . Hypertension Paternal Grandfather   . Fibroids Maternal Grandmother   . Osteoporosis Maternal Grandmother     Social History   Socioeconomic History  . Marital status: Single    Spouse name: Not on file  . Number of children: Not on file  . Years of education: Not on file  . Highest education level: Not on file  Occupational History  . Not on file  Tobacco Use  . Smoking status: Current Every Day Smoker    Types: Cigarettes  . Smokeless tobacco: Never Used  Substance and Sexual Activity  . Alcohol use: Not Currently  . Drug use: Not Currently    Comment: Clean for 2 months (went to rehab 05/08/16)  . Sexual activity: Yes    Partners: Male  Other Topics Concern  . Not on file  Social History Narrative  . Not on file   Social Determinants of Health   Financial Resource Strain:   . Difficulty of Paying Living Expenses:   Food Insecurity:   . Worried About  Running Out of Food in the Last Year:   . [place] in the Last Year:   Transportation Needs:   . Lack of Transportation (Medical):   Marland Kitchen Lack of Transportation (Non-Medical):   Physical Activity:   . Days of Exercise per Week:   . Minutes of Exercise per Session:   Stress:   . Feeling of Stress :   Social Connections:   . Frequency of Communication with Friends and Family:   . Frequency of Social Gatherings with Friends and Family:   . Attends Religious Services:   . Active Member of Clubs or Organizations:   . Attends Archivist Meetings:   Marland Kitchen Marital Status:   Intimate Partner Violence:   . Fear of Current or Ex-Partner:   . Emotionally Abused:   Marland Kitchen Physically Abused:   . Sexually Abused:     ROS  PHYSICAL EXAMINATION:    BP 122/66   Pulse 81   Temp 99 F (37.2 C)   Ht 5\' 5"  (1.651 m)   Wt 119 lb 3.2 oz (54.1 kg)    LMP 01/06/2020 (Exact Date)   SpO2 99%   BMI 19.84 kg/m     General appearance: alert, cooperative and appears stated age  Viable IUP c/w LMP, FHR 160, small CL on the right. See full ultrasound report. Images reviewed with the patient.   ASSESSMENT Viable IUP, c/w LMP Pelvic cramping, suspect from constipation    PLAN F/U with OB Information on medications safe in pregnancy and commonly asked questions in pregnancy given to the patient.  Recommended she increase her fluid, fruit and fiber intake Can take colace for constipation.    An After Visit Summary was printed and given to the patient.  In addition to reviewing the ultrasound images, we discussed constipation, cramping and vaginal discharge. Management discussed.

## 2020-03-15 LAB — OB RESULTS CONSOLE HIV ANTIBODY (ROUTINE TESTING): HIV: NONREACTIVE

## 2020-03-15 LAB — OB RESULTS CONSOLE GC/CHLAMYDIA
Chlamydia: NEGATIVE
Gonorrhea: NEGATIVE

## 2020-03-15 LAB — OB RESULTS CONSOLE ABO/RH: RH Type: POSITIVE

## 2020-03-15 LAB — OB RESULTS CONSOLE RUBELLA ANTIBODY, IGM: Rubella: NON-IMMUNE/NOT IMMUNE

## 2020-03-15 LAB — OB RESULTS CONSOLE HEPATITIS B SURFACE ANTIGEN: Hepatitis B Surface Ag: NEGATIVE

## 2020-03-15 LAB — OB RESULTS CONSOLE RPR: RPR: NONREACTIVE

## 2020-08-15 ENCOUNTER — Observation Stay (HOSPITAL_COMMUNITY)
Admission: AD | Admit: 2020-08-15 | Discharge: 2020-08-16 | Disposition: A | Discharge status: Home / Self Care | Payer: 59 | Attending: Emergency Medicine | Admitting: Emergency Medicine

## 2020-08-15 ENCOUNTER — Other Ambulatory Visit: Payer: Self-pay

## 2020-08-15 ENCOUNTER — Inpatient Hospital Stay (HOSPITAL_BASED_OUTPATIENT_CLINIC_OR_DEPARTMENT_OTHER): Payer: 59

## 2020-08-15 ENCOUNTER — Encounter (HOSPITAL_COMMUNITY): Payer: Self-pay | Admitting: Obstetrics

## 2020-08-15 DIAGNOSIS — O4690 Antepartum hemorrhage, unspecified, unspecified trimester: Secondary | ICD-10-CM

## 2020-08-15 DIAGNOSIS — O99343 Other mental disorders complicating pregnancy, third trimester: Secondary | ICD-10-CM | POA: Diagnosis not present

## 2020-08-15 DIAGNOSIS — Z20822 Contact with and (suspected) exposure to covid-19: Secondary | ICD-10-CM | POA: Diagnosis not present

## 2020-08-15 DIAGNOSIS — O4693 Antepartum hemorrhage, unspecified, third trimester: Secondary | ICD-10-CM | POA: Diagnosis not present

## 2020-08-15 DIAGNOSIS — F319 Bipolar disorder, unspecified: Secondary | ICD-10-CM | POA: Insufficient documentation

## 2020-08-15 DIAGNOSIS — Z3A31 31 weeks gestation of pregnancy: Secondary | ICD-10-CM

## 2020-08-15 LAB — URINALYSIS, ROUTINE W REFLEX MICROSCOPIC
Bilirubin Urine: NEGATIVE
Glucose, UA: NEGATIVE mg/dL
Hgb urine dipstick: NEGATIVE
Ketones, ur: NEGATIVE mg/dL
Leukocytes,Ua: NEGATIVE
Nitrite: NEGATIVE
Protein, ur: NEGATIVE mg/dL
Specific Gravity, Urine: 1.016 (ref 1.005–1.030)
pH: 6 (ref 5.0–8.0)

## 2020-08-15 LAB — TYPE AND SCREEN
ABO/RH(D): O POS
Antibody Screen: NEGATIVE

## 2020-08-15 LAB — WET PREP, GENITAL
Sperm: NONE SEEN
Trich, Wet Prep: NONE SEEN
Yeast Wet Prep HPF POC: NONE SEEN

## 2020-08-15 LAB — RAPID URINE DRUG SCREEN, HOSP PERFORMED
Amphetamines: NOT DETECTED
Barbiturates: NOT DETECTED
Benzodiazepines: NOT DETECTED
Cocaine: NOT DETECTED
Opiates: NOT DETECTED
Tetrahydrocannabinol: NOT DETECTED

## 2020-08-15 LAB — CBC
HCT: 36.2 % (ref 36.0–46.0)
Hemoglobin: 11.6 g/dL — ABNORMAL LOW (ref 12.0–15.0)
MCH: 28.5 pg (ref 26.0–34.0)
MCHC: 32 g/dL (ref 30.0–36.0)
MCV: 88.9 fL (ref 80.0–100.0)
Platelets: 210 10*3/uL (ref 150–400)
RBC: 4.07 MIL/uL (ref 3.87–5.11)
RDW: 12.4 % (ref 11.5–15.5)
WBC: 11.4 10*3/uL — ABNORMAL HIGH (ref 4.0–10.5)
nRBC: 0 % (ref 0.0–0.2)

## 2020-08-15 LAB — SARS CORONAVIRUS 2 BY RT PCR (HOSPITAL ORDER, PERFORMED IN ~~LOC~~ HOSPITAL LAB): SARS Coronavirus 2: NEGATIVE

## 2020-08-15 MED ORDER — DOCUSATE SODIUM 100 MG PO CAPS
100.0000 mg | ORAL_CAPSULE | Freq: Every day | ORAL | Status: DC
  Administered 2020-08-16: 100 mg via ORAL
  Filled 2020-08-15: qty 1

## 2020-08-15 MED ORDER — PRENATAL MULTIVITAMIN CH
1.0000 | ORAL_TABLET | Freq: Every day | ORAL | Status: DC
  Administered 2020-08-16: 1 via ORAL
  Filled 2020-08-15: qty 1

## 2020-08-15 MED ORDER — BETAMETHASONE SOD PHOS & ACET 6 (3-3) MG/ML IJ SUSP
12.0000 mg | INTRAMUSCULAR | Status: AC
  Administered 2020-08-15 – 2020-08-16 (×2): 12 mg via INTRAMUSCULAR
  Filled 2020-08-15: qty 5

## 2020-08-15 MED ORDER — ZOLPIDEM TARTRATE 5 MG PO TABS: 5.0000 mg | ORAL_TABLET | Freq: Every evening | ORAL | Status: DC | PRN

## 2020-08-15 MED ORDER — NIFEDIPINE 10 MG PO CAPS
10.0000 mg | ORAL_CAPSULE | Freq: Once | ORAL | Status: AC
  Administered 2020-08-15: 10 mg via ORAL
  Filled 2020-08-15: qty 1

## 2020-08-15 MED ORDER — ACETAMINOPHEN 325 MG PO TABS: 650.0000 mg | ORAL_TABLET | ORAL | Status: DC | PRN

## 2020-08-15 MED ORDER — LACTATED RINGERS IV SOLN: INTRAVENOUS | Status: DC

## 2020-08-15 MED ORDER — CALCIUM CARBONATE ANTACID 500 MG PO CHEW: 2.0000 | CHEWABLE_TABLET | ORAL | Status: DC | PRN

## 2020-08-15 MED ORDER — LACTATED RINGERS IV BOLUS
500.0000 mL | INTRAVENOUS | Status: AC
  Administered 2020-08-15 (×2): 500 mL via INTRAVENOUS

## 2020-08-15 NOTE — ED Triage Notes (Signed)
Emergency Medicine Provider OB Triage Evaluation Note  [name] [name] is a 24 y.o. female, G1P0, at Unknown gestation who presents to the emergency department with complaints of vaginal bleeding that happened 1 hour ago.  Patient states while she was using the bathroom she started to have vaginal bleeding which has now stopped.  She denies recent trauma, pelvic pain, abdominal pain, nausea, vomiting, diarrhea or urinary symptoms.  She states she feels her baby kicking and has no other complaints at this time.  She has had normal OB follow-ups and has had no complications.  She contacted her OB and she told her to come to the emergency department to be evaluated as their office was closed.  Patient denies headache, fever, chills, shortness of breath, chest pain, abdominal pain, nausea, vomiting, diarrhea, pedal edema.  Review of  Systems  Positive: GU vaginal bleeding. Negative: All other systems negative.  Physical Exam  BP 127/89   Pulse 92   Temp 99.4 F (37.4 C)   Resp 14   LMP 01/06/2020 (Exact Date)   SpO2 100%  General: Awake, no distress  HEENT: Atraumatic  Resp: Normal effort  Cardiac: Normal rate Abd: Nondistended, nontender  MSK: Moves all extremities without difficulty Neuro: Speech clear  Medical Decision Making  Pt evaluated for pregnancy concern and is stable for transfer to MAU. Pt is in agreement with plan for transfer.  6:04 PM Discussed with MAU APP, Joni Reining, who accepts patient in transfer.  Clinical Impression  No diagnosis found.  Patient has vaginal bleeding and has been accepted by the MAU unit.  She is stable for discharge and is ready for transport.   Carroll Sage, PA-C 08/15/20 1829

## 2020-08-15 NOTE — MAU Note (Signed)
Noted bleeding when she used the restroom, only when she wiped. Denies pain.  No placental issues noted on [place]. Denies recent intercourse.

## 2020-08-15 NOTE — MAU Provider Note (Addendum)
History     CSN: [id number]  Arrival date and time: 08/15/20 1731   First Provider Initiated Contact with Patient 08/15/20 1926      Chief Complaint  Patient presents with  . Vaginal Bleeding   HPI   Ms.[name] [name] is a 24 y.o. female G1P0 @ [redacted]w[redacted]d here with vaginal bleeding which started today around 1700 today. She has no pain. Denies recent intercourse, or constipation. Presented to the [facility] ED today and was transferred to MAU. She has a history of drug use and ETOH use, none since pregnancy. Reports seeing small amounts of blood when she wipes only, none in the toilet or in her underwear. She has no placenta issues. Healthy pregnancy, low risk.   OB History    Gravida  1   Para      Term      Preterm      AB      Living        SAB      TAB      Ectopic      Multiple      Live Births              Past Medical History:  Diagnosis Date  . Abnormal Pap smear of cervix    08-05-17 ASCUS HPV HR+, no procedure due to age  . Bipolar 1 disorder, depressed (HCC) 01/2016  . Irregular menstrual cycle 06/19/2014  . 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 PTSD (post-traumatic stress disorder) 05/2016  . PTSD (post-traumatic stress disorder)   . Substance abuse (HCC) 05/08/2016   Went to Rehab on 05/08/16 for 2 months.     Past Surgical History:  Procedure Laterality Date  . MOUTH SURGERY  4 years ago     Family History  Problem Relation Age of Onset  . Prostate cancer Father   . Diabetes Mellitus II Father   . Hypertension Father   . Diabetes Mellitus II Paternal Grandmother   . Depression Paternal Grandmother   . Hypertension Paternal Grandmother   . Diabetes Mellitus II Paternal Grandfather   . Hypertension Paternal Grandfather   . Fibroids Maternal Grandmother   . Osteoporosis Maternal Grandmother     Social History   Tobacco Use  . Smoking status: Former Smoker    Types: Cigarettes  . Smokeless tobacco: Never Used   Substance Use Topics  . Alcohol use: Not Currently  . Drug use: Not Currently    Comment: Clean for 2 months (went to rehab 05/08/16)    Allergies:  Allergies  Allergen Reactions  . Amoxicillin Rash    Medications Prior to Admission  Medication Sig Dispense Refill Last Dose  . ferrous sulfate 325 (65 FE) MG EC tablet Take 325 mg by mouth 3 (three) times daily with meals.   08/14/2020 at Unknown time  . Oyster Shell (OYSTER CALCIUM) 500 MG TABS tablet Take 500 mg of elemental calcium by mouth 2 (two) times daily.   08/14/2020 at Unknown time  . Prenatal Vit-Fe Fumarate-FA (MULTIVITAMIN-PRENATAL) 27-0.8 MG TABS tablet Take 1 tablet by mouth daily at 12 noon.   08/14/2020 at Unknown time   Results for orders placed or performed during the hospital encounter of 08/15/20 (from the past 48 hour(s))  Wet prep, genital     Status: Abnormal   Collection Time: 08/15/20  7:38 PM   Specimen: PATH Cytology Cervicovaginal Ancillary Only  Result Value Ref Range   Yeast Wet Prep HPF POC  NONE SEEN NONE SEEN   Trich, Wet Prep NONE SEEN NONE SEEN   Clue Cells Wet Prep HPF POC PRESENT (A) NONE SEEN   WBC, Wet Prep HPF POC MANY (A) NONE SEEN   Sperm NONE SEEN     Comment: Performed at [facility], [address] [place], [place] [address]    Review of Systems  Gastrointestinal: Negative for abdominal pain.  Genitourinary: Positive for vaginal discharge. Negative for dysuria.   Physical Exam   Blood pressure 116/66, pulse 89, temperature 99.1 F (37.3 C), temperature source Oral, resp. rate 17, height 5\' 5"  (1.651 m), weight 66.2 kg, last menstrual period 01/06/2020, SpO2 99 %.  Physical Exam Constitutional:      General: She is not in acute distress.    Appearance: Normal appearance. She is not ill-appearing, toxic-appearing or diaphoretic.  HENT:     Head: Normocephalic.  Pulmonary:     Effort: Pulmonary effort is normal.  Abdominal:     Palpations: Abdomen is soft.  Genitourinary:     Comments: Vagina - moderate amount of white/yellow vaginal discharge, no odor, no blood in vault.  Cervix - + contact bleeding immediately with Qtip.  Bimanual exam: Cervix closed, anterior.  GC/Chlam, wet prep done Chaperone present for exam.  Musculoskeletal:        General: Normal range of motion.  Neurological:     Mental Status: She is alert and oriented to person, place, and time.  Psychiatric:        Mood and Affect: Mood normal.    Fetal Tracing: Baseline: 130 bpm Variability: Moderate  Accelerations: 15x15 Decelerations: None Toco: Frequent UI's, 2-4 mins apart.   MAU Course  Procedures  None  MDM  O positive blood type.  Wet prep and GC collected  UA with urine drug screen  Procardia 1 dose given with oral hydration 01/08/2020 OB limited ordered. Dr. [place] presented to MAU asking for report on the patient. Dr. Ernestina Penna plans to admit patient under her service and she resumes the care of the patient in MAU.   Assessment and Plan   A:  Friable cervix  Frequent contractions; likely braxton hicks @ [redacted]w[redacted]d Reactive NST, category 1   P:  Dr. [redacted]w[redacted]d to resume care of the patient in MAU  Dhruvan Gullion, Ernestina Penna, NP 08/15/2020 8:10 PM

## 2020-08-15 NOTE — Progress Notes (Addendum)
Dr. Ernestina Penna made aware of pt contraction pattern. RN instructed to give pt 1000cc bolus over two hours, and to continue LR running at 125 after bolus, as ordered. RN instructed to notify MD if pt begins to feel contractions.

## 2020-08-15 NOTE — H&P (Signed)
Chief complaint: Vaginal bleeding  History of present illness 24 year old G1 at 31 weeks and 5 days x 7-week ultrasound presents with painless vaginal bleeding around 5 PM today.  Patient reports no recent exercise or intercourse but had a quarter sized spot of bleeding.  Patient reports no contractions and has not been feeling fetal movement.  Patient reports no vaginal discharge, no leakage of fluid.  Patient is not concerned about STDs and reports last intercourse was about a week ago.  Obstetric history: Unplanned but desired pregnancy, anemia, history of drug use though none in the pregnancy  Past Medical History:  Diagnosis Date  . Abnormal Pap smear of cervix    08-05-17 ASCUS HPV HR+, no procedure due to age  . Bipolar 1 disorder, depressed (HCC) 01/2016  . Irregular menstrual cycle 06/19/2014  . 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 PTSD (post-traumatic stress disorder) 05/2016  . PTSD (post-traumatic stress disorder)   . Substance abuse (HCC) 05/08/2016   Went to Rehab on 05/08/16 for 2 months.     Physical exam: Vitals:   08/15/20 1740 08/15/20 1839 08/15/20 1904  BP: 127/89 120/66 116/66  Pulse: 92 86 89  Temp: 99.4 F (37.4 C) 99.1 F (37.3 C)   Resp: 14 17   Height:  5\' 5"  (1.651 m)   Weight:  66.2 kg   SpO2: 100% 99%   TempSrc:  Oral   BMI (Calculated):  24.28    General: Well-appearing, no distress Abdomen: Gravid, no right upper quadrant tenderness, no fundal tenderness, no costovertebral angle tenderness Lower extremity: Nontender, no edema GU: No bleeding in the vaginal vault, cervix does not appear to be friable, no vaginal discharge or leakage of fluid: External os 1 cm, internal os closed, cervix uneffaced firm and posterior  Toco: Irritability versus contractions every 2 to 4 minutes FH: 145's, positive accelerations, no decelerations, 10 beat variability  Real-time ultrasound: Report pending.  I was present for the ultrasound.  Excellent  fetal movement, normal AFI, normal-appearing placenta with no evidence of placental abruption.  Will cervical length was not performed abdominal assessment of the cervix showed it to be long and closed with no funneling  Wet prep: Performed by lab: Many white blood cells, clue present no trichomoniasis no yeast UA normal  Assessment plan: 24 year old G1 at 31 weeks and 5 days by  7-week ultrasound with painless vaginal bleeding which has now stopped but patient with continued contractions versus irritability.  No blood on my exam and while external os is 1 cm dilated internal os is closed and cervix is long and firm.  Patient did have a digital exam prior to my arrival so unable to perform an FFN.  Patient was given 10 mg of oral Procardia by MAU staff.  At this time the etiology of the vaginal bleeding, while stopped, is unclear.  Her contractions are more concerning to me.  Will start IV fluids as dehydration is a possibility but given her early gestational age will give betamethasone.  Will defer additional Procardia at this time however should contractions become stronger to the patient will plan tocolyse this while completing betamethasone course.  Patient denies current drug use but does have a history of cocaine abuse and toxicology screen is pending.  Cervical cultures done.  Defer magnesium neuroprophylaxis and GBS culture at this time as no strong evidence for preterm labor.  Reactive fetal testing.  25 08/15/2020 8:37 PM

## 2020-08-16 DIAGNOSIS — O4693 Antepartum hemorrhage, unspecified, third trimester: Secondary | ICD-10-CM | POA: Diagnosis not present

## 2020-08-16 LAB — GC/CHLAMYDIA PROBE AMP (~~LOC~~) NOT AT ARMC
Chlamydia: NEGATIVE
Comment: NEGATIVE
Comment: NORMAL
Neisseria Gonorrhea: NEGATIVE

## 2020-08-16 MED ORDER — METRONIDAZOLE 500 MG PO TABS
500.0000 mg | ORAL_TABLET | Freq: Two times a day (BID) | ORAL | 0 refills | Status: DC
Start: 2020-08-16 — End: 2020-10-14

## 2020-08-16 NOTE — Progress Notes (Signed)
Discharge instructions given to pt. Pt denies any needs or concerns. Pt leaving via wheelchair with father. Vital signs within normal limits.

## 2020-08-16 NOTE — Discharge Summary (Signed)
Physician Discharge Summary  Patient ID: [name] [name] MRN: [id number] DOB/AGE: [birth year] 24 y.o.  Admit date: 08/15/2020 Discharge date: 08/16/2020  Admission Diagnoses: [redacted]w[redacted]d vaginal bleeding; preterm labor contractions  Discharge Diagnoses:  Active Problems:   Vaginal bleeding in pregnancy, third trimester   Discharged Condition: stable  Hospital Course: 24 year old G1 at 31 weeks and 5 days by  7-week ultrasound with painless vaginal bleeding which has now stopped but patient with continued contractions versus irritability.  Exam performed by Dr. Ernestina Penna yesterday evening, noting no blood on exam and while external os is 1 cm dilated internal os is closed and cervix is long and firm. She received 1 dose of Procardia 10mg  x 1.  She was admitted for 24 hour observation, IV hydration, and betamethasone course. She was rechecked by Dr. around 11:45am and exam was unchanged.  She has been having occasional contractions throughout the day in which she has not felt.  She was discharged home after 2nd dose of Betamethasone in stable condition.  We will treat her for asymptomatic bacterial vaginosis and rx for Flagyl 500mg  BID x 7 days was sent in.   Consults: None  Significant Diagnostic Studies: Real-time ultrasound: Report pending.  I was present for the ultrasound.  Excellent fetal movement, normal AFI, normal-appearing placenta with no evidence of placental abruption.  Will cervical length was not performed abdominal assessment of the cervix showed it to be long and closed with no funneling  Wet prep: Performed by lab: Many white blood cells, clue present no trichomoniasis no yeast UA normal   Treatments: IV hydration, antibiotics: metronidazole and steroids: Betamethasone   Discharge Exam: Blood pressure 108/61, pulse 88, temperature 98.5 F (36.9 C), temperature source Oral, resp. rate 20, height 5\' 5"  (1.651 m), weight 67.5 kg, last menstrual period 01/06/2020, SpO2 99  %. General appearance: alert and cooperative  GU: deferred as she was recently examined today  NST: FHR 135/ moderate variability/ +15x15 accels/ no decels Toco: occasional ctxs with some irritability   Disposition: Discharge disposition: 01-Home or Self Care       Discharge Instructions    Discharge activity:   Complete by: As directed    Modified activity; no heavy lifting, no strenuous exercise   Discharge diet:  No restrictions   Complete by: As directed    Do not have sex or do anything that might make you have an orgasm   Complete by: As directed    Fetal Kick Count:  Lie on our left side for one hour after a meal, and count the number of times your baby kicks.  If it is less than 5 times, get up, move around and drink some juice.  Repeat the test 30 minutes later.  If it is still less than 5 kicks in an hour, notify your doctor.   Complete by: As directed    Notify physician for a general feeling that "something is not right"   Complete by: As directed    Notify physician for increase or change in vaginal discharge   Complete by: As directed    Notify physician for intestinal cramps, with or without diarrhea, sometimes described as "gas pain"   Complete by: As directed    Notify physician for leaking of fluid   Complete by: As directed    Notify physician for low, dull backache, unrelieved by heat or Tylenol   Complete by: As directed    Notify physician for menstrual like cramps   Complete by: As  directed    Notify physician for pelvic pressure   Complete by: As directed    Notify physician for uterine contractions.  These may be painless and feel like the uterus is tightening or the baby is  "balling up"   Complete by: As directed    Notify physician for vaginal bleeding   Complete by: As directed    PRETERM LABOR:  Includes any of the follwing symptoms that occur between 20 - [redacted] weeks gestation.  If these symptoms are not stopped, preterm labor can result in preterm  delivery, placing your baby at risk   Complete by: As directed      Allergies as of 08/16/2020      Reactions   Amoxicillin Rash      Medication List    STOP taking these medications   oyster calcium 500 MG Tabs tablet     TAKE these medications   ferrous sulfate 325 (65 FE) MG EC tablet Take 325 mg by mouth daily with breakfast.   metroNIDAZOLE 500 MG tablet Commonly known as: Flagyl Take 1 tablet (500 mg total) by mouth 2 (two) times daily.   multivitamin-prenatal 27-0.8 MG Tabs tablet Take 1 tablet by mouth daily at 12 noon.       Follow-up Information    June Leap, CNM. Schedule an appointment as soon as possible for a visit on 08/20/2020.   Specialty: Certified Nurse Midwife Why: F/u OB appt Contact information: [address] [place] [address] [phone]              Plan of care in consult with Dr. Ernestina Penna and Dr. Conni Elliot   Signed: Karena Addison 08/16/2020, 9:05 PM

## 2020-08-16 NOTE — Progress Notes (Signed)
Hospital day #2 admission for vaginal bleeding contractions in third trimester  Subjective: Patient notes adequate rest overnight.  Tolerating regular p.o. without nausea or vomiting.  Bed without dizziness.  No chest pain, no shortness of breath, normal voiding.  Patient noted some contractions around midnight and then another single contraction at 4 AM.  No contractions since then.  No further vaginal bleeding.  Active fetal movement  Objective: Vitals with BMI 08/16/2020 08/16/2020 08/16/2020  Height - - -  Weight 148 lbs 13 oz - -  BMI 24.76 - -  Systolic 108 109 026  Diastolic 52 58 55  Pulse 72 79 88  Some encounter information is confidential and restricted. Go to Review Flowsheets activity to see all data.   General: Well-appearing no distress Abdomen: Gravid, nontender, no fundal tenderness, no right upper quadrant pain Cardiovascular: Regular rate and rhythm Pulmonary: Clear to auscultation bilaterally GU: No blood staining or abnormal vaginal discharge in the perineum.  Cervix checked external os 1 cm internal os closed cervix uneffaced firm a posterior, vertex palpated through the lower uterine segment Lower extremity: SCDs in place, no edema  CBC    Component Value Date/Time   WBC 11.4 (H) 08/15/2020 2051   RBC 4.07 08/15/2020 2051   HGB 11.6 (L) 08/15/2020 2051   HGB 11.2 (L) 06/19/2014 0909   HCT 36.2 08/15/2020 2051   PLT 210 08/15/2020 2051   MCV 88.9 08/15/2020 2051   MCH 28.5 08/15/2020 2051   MCHC 32.0 08/15/2020 2051   RDW 12.4 08/15/2020 2051   LYMPHSABS 1.5 07/23/2015 1342   MONOABS 0.4 07/23/2015 1342   EOSABS 0.1 07/23/2015 1342   BASOSABS 0.0 07/23/2015 1342    NST: FH 130s, positive accelerations no decelerations, 10 beat variability Toco: Rare contractions  Assessment plan: G1 at 31 weeks with unexplained vaginal bleeding which is now subsided and never headache.  This was also associated with uterine irritability and contractions of unclear etiology.   Patient did receive 1 dose of Procardia around 6 PM 1 She did have IV fluids and now no significant contractions Irritability.  No cervical change noted.  We will continue to monitor patient through the day we can move up her second betamethasone several hours that she can go home reasonable time today.  Precautions reviewed with patient  Reactive fetal testing  Bacterial vaginosis noted on wet prep, asymptomatic but reasonable to treat upon discharge  Lendon Colonel 08/16/2020 11:47 AM

## 2020-09-17 LAB — OB RESULTS CONSOLE GBS: GBS: NEGATIVE

## 2020-10-07 IMAGING — US US MFM OB LIMITED
1 series · 15 of 22 positions shown · non-contrast
Comparison: none

[Series 1: us mfm ob limited · 15 of 22 slices shown]
[im 1/22]
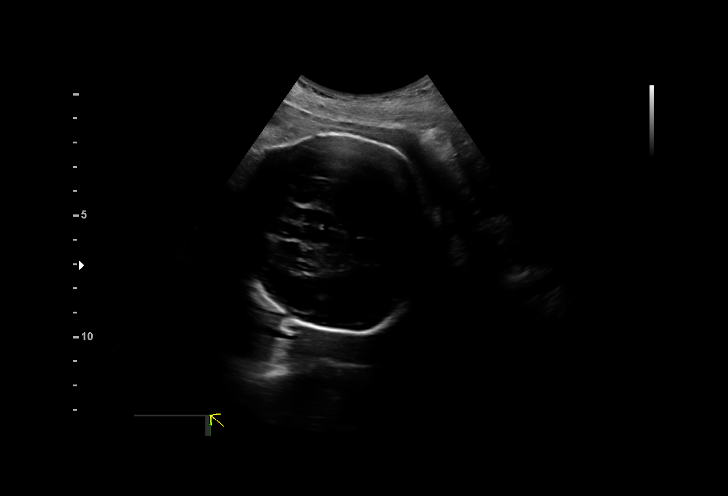
[im 3/22]
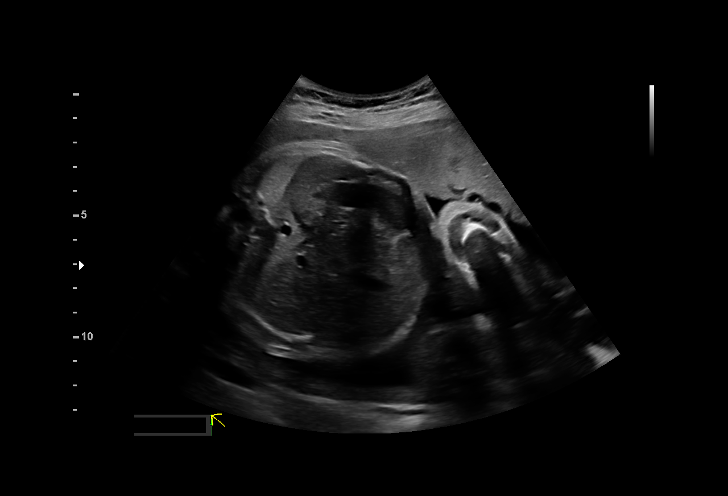
[im 4/22]
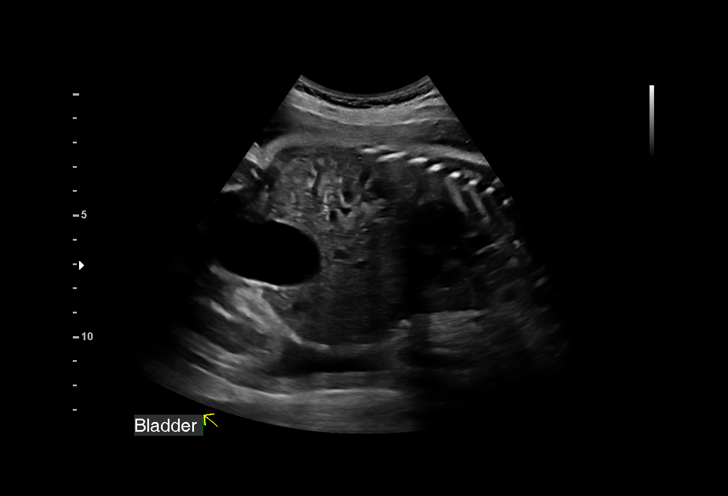
[im 6/22]
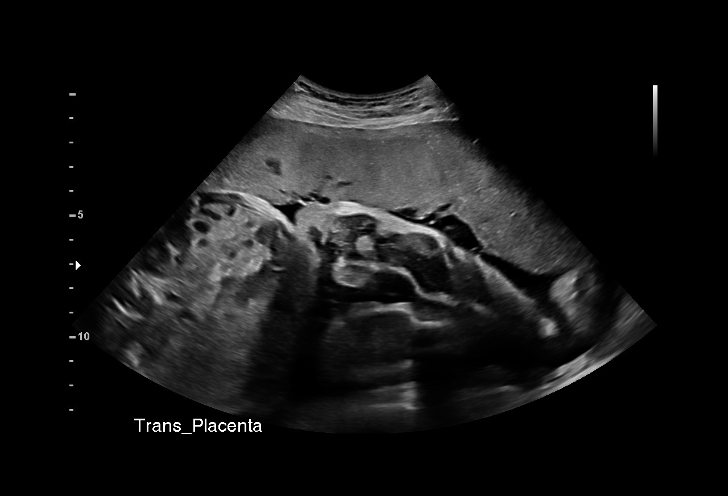
[im 7/22]
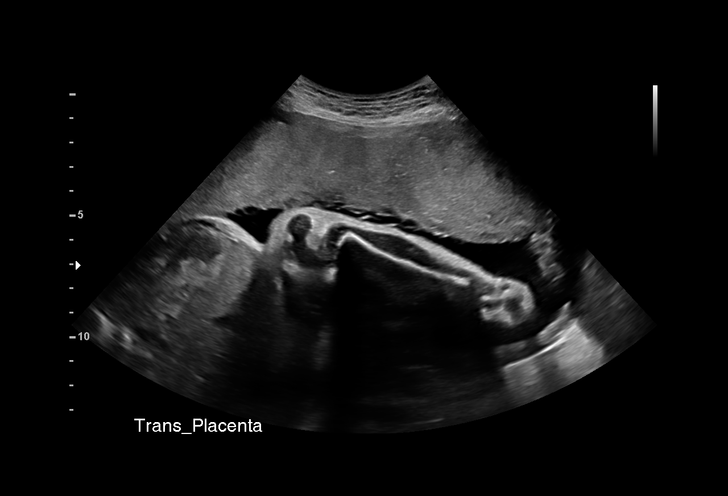
[im 9/22]
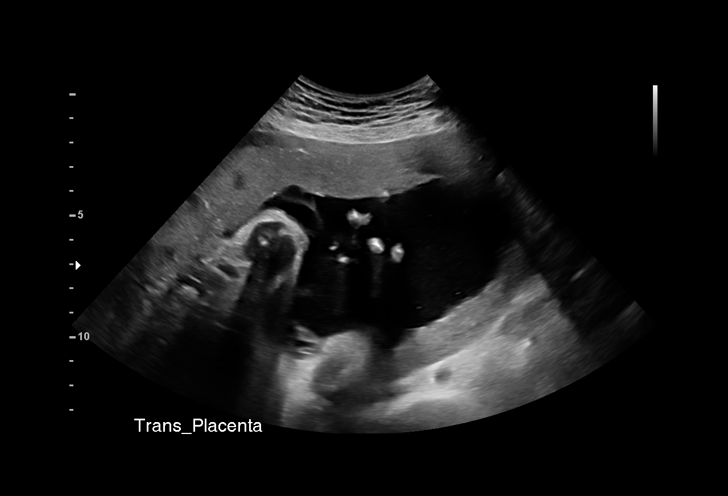
[im 10/22]
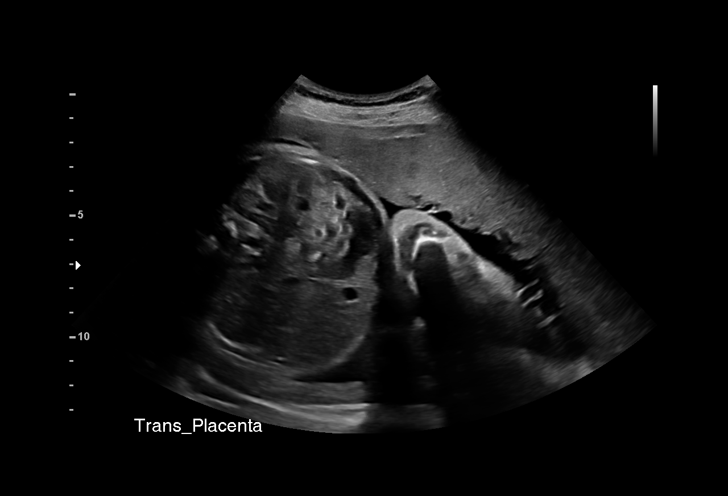
[im 12/22]
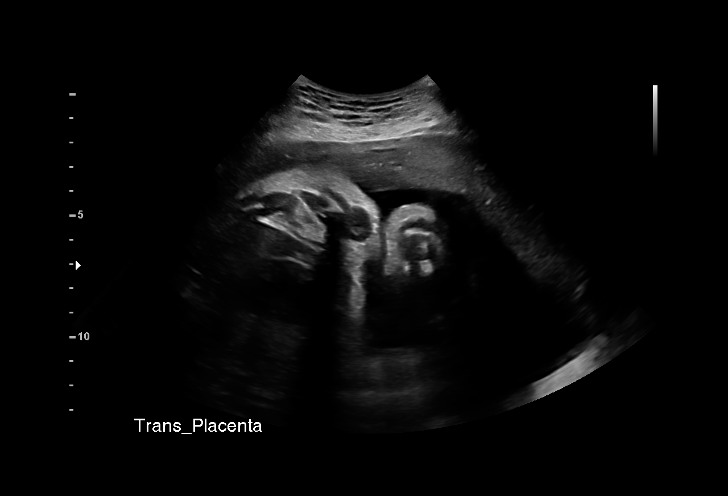
[im 13/22]
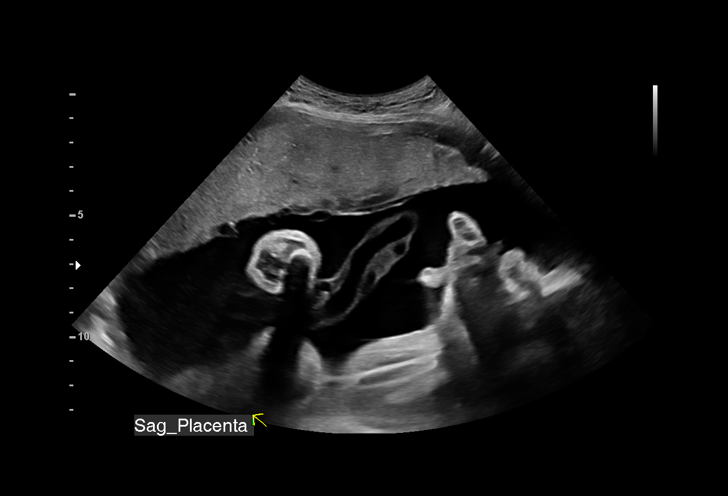
[im 14/22]
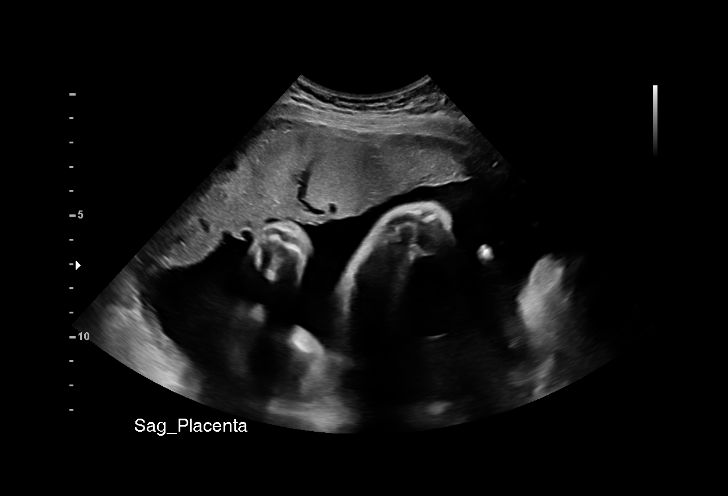
[im 16/22]
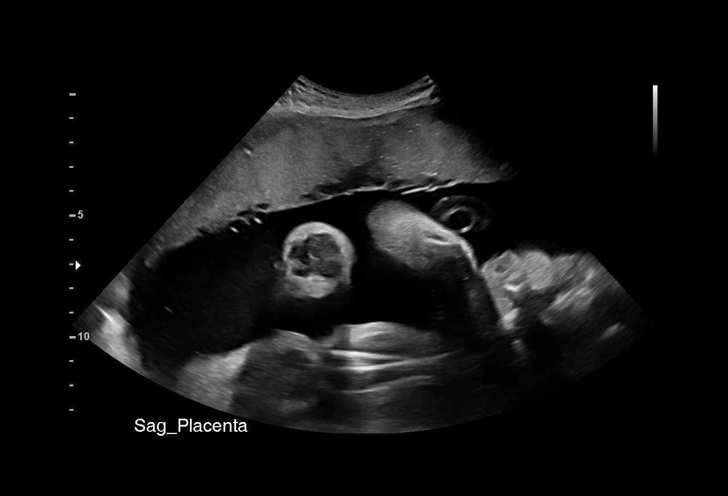
[im 17/22]
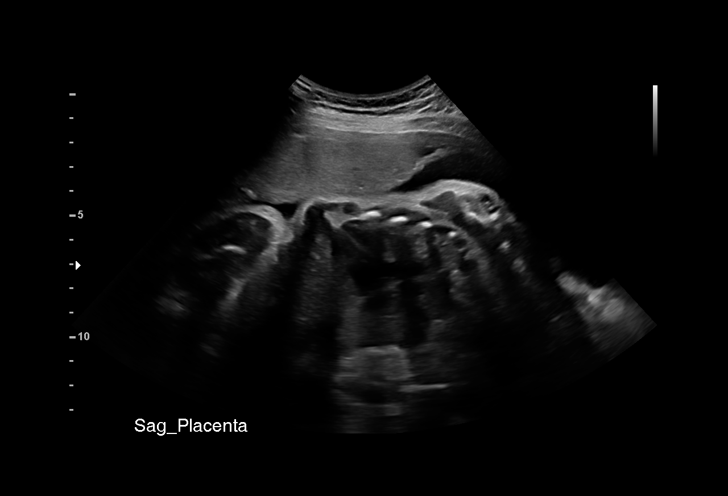
[im 19/22]
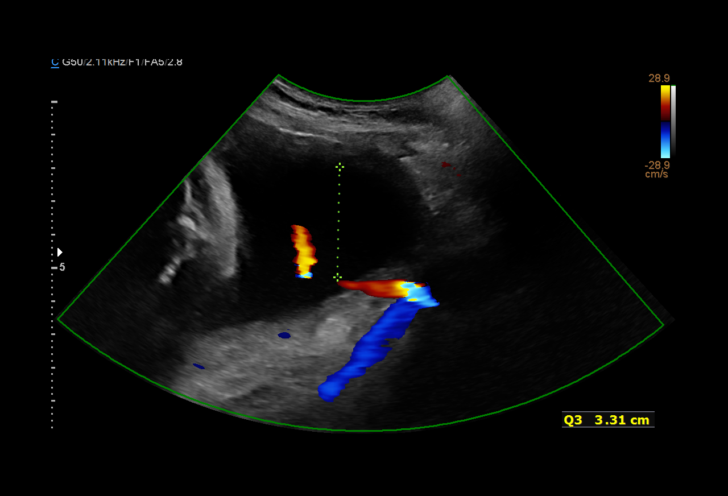
[im 20/22]
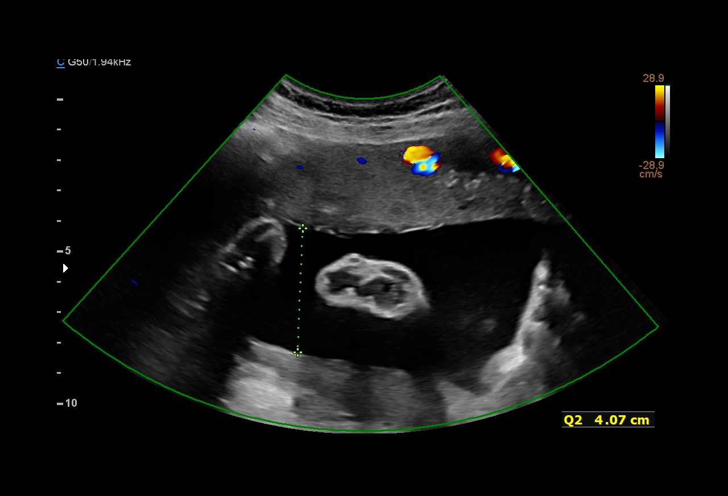
[im 22/22]
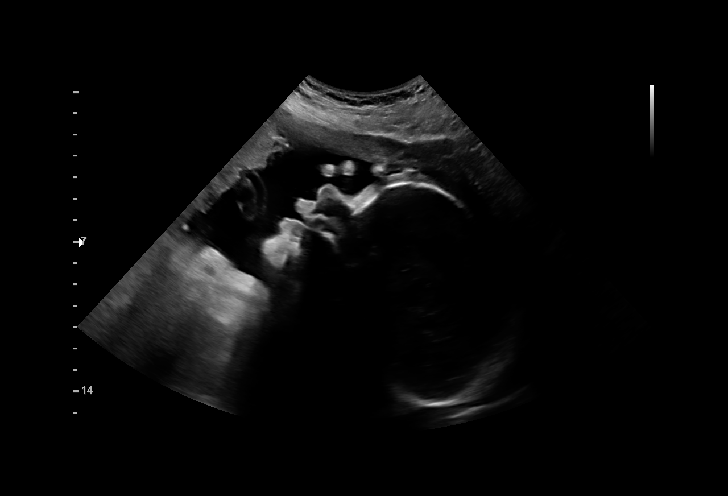

[15 of 22 positions shown; findings below may reference images not displayed]

MARG NP

 1  US MFM OB LIMITED                     76815.01    AMEDEJ ADZAJLIC

Indications

 Vaginal bleeding in pregnancy, third
 trimester
 31 weeks gestation of pregnancy
Fetal Evaluation

 Num Of Fetuses:          1
 Fetal Heart              136
 Rate(bpm):
 Cardiac Activity:        Observed
 Presentation:            Cephalic
 Placenta:                No abruption or previa seen, anterior

 Amniotic Fluid
 AFI FV:      Within normal limits

 AFI Sum(cm)     %Tile       Largest Pocket(cm)
 17.6            65

 RUQ(cm)       RLQ(cm)       LUQ(cm)        LLQ(cm)

OB History

 Gravidity:    1
Gestational Age

 LMP:           31w 5d        Date:  01/06/20                 EDD:    10/12/20
 Best:          31w 5d     Det. By:  LMP  (01/06/20)          EDD:    10/12/20
Cervix Uterus Adnexa

 Cervix
 Not visualized (advanced GA >86wks)
Impression

 Limited exam to assess vaginal bleeding and uncertain dates
 There is good fetal movement and amniotic fluid
 There is no evidence of placental previa or placenta previa
Recommendations

 Clinical correlation recommended.

## 2020-10-09 ENCOUNTER — Telehealth (HOSPITAL_COMMUNITY): Payer: Self-pay | Admitting: *Deleted

## 2020-10-09 ENCOUNTER — Encounter (HOSPITAL_COMMUNITY): Payer: Self-pay | Admitting: *Deleted

## 2020-10-09 NOTE — Telephone Encounter (Signed)
Preadmission screen  

## 2020-10-10 ENCOUNTER — Other Ambulatory Visit: Payer: Self-pay | Admitting: Certified Nurse Midwife

## 2020-10-10 ENCOUNTER — Other Ambulatory Visit (HOSPITAL_COMMUNITY): Admission: RE | Admit: 2020-10-10 | Payer: 59 | Source: Ambulatory Visit

## 2020-10-11 ENCOUNTER — Other Ambulatory Visit (HOSPITAL_COMMUNITY)
Admission: RE | Admit: 2020-10-11 | Discharge: 2020-10-11 | Disposition: A | Discharge status: Home / Self Care | Payer: 59 | Source: Ambulatory Visit | Attending: Obstetrics | Admitting: Obstetrics

## 2020-10-11 DIAGNOSIS — Z01818 Encounter for other preprocedural examination: Secondary | ICD-10-CM | POA: Insufficient documentation

## 2020-10-11 DIAGNOSIS — Z20822 Contact with and (suspected) exposure to covid-19: Secondary | ICD-10-CM | POA: Insufficient documentation

## 2020-10-11 LAB — SARS CORONAVIRUS 2 (TAT 6-24 HRS): SARS Coronavirus 2: NEGATIVE

## 2020-10-12 ENCOUNTER — Inpatient Hospital Stay (HOSPITAL_COMMUNITY): Payer: 59 | Admitting: Anesthesiology

## 2020-10-12 ENCOUNTER — Other Ambulatory Visit: Payer: Self-pay

## 2020-10-12 ENCOUNTER — Inpatient Hospital Stay (HOSPITAL_COMMUNITY)
Admission: AD | Admit: 2020-10-12 | Discharge: 2020-10-14 | DRG: 784 | Disposition: A | Discharge status: Home / Self Care | Payer: 59 | Attending: Obstetrics & Gynecology | Admitting: Obstetrics & Gynecology

## 2020-10-12 ENCOUNTER — Encounter (HOSPITAL_COMMUNITY): Payer: Self-pay | Admitting: Certified Nurse Midwife

## 2020-10-12 ENCOUNTER — Inpatient Hospital Stay (HOSPITAL_COMMUNITY): Payer: 59

## 2020-10-12 ENCOUNTER — Encounter (HOSPITAL_COMMUNITY)
Admission: AD | Disposition: A | Discharge status: Home / Self Care | Payer: Self-pay | Source: Home / Self Care | Attending: Obstetrics & Gynecology

## 2020-10-12 DIAGNOSIS — Z3490 Encounter for supervision of normal pregnancy, unspecified, unspecified trimester: Secondary | ICD-10-CM | POA: Diagnosis present

## 2020-10-12 DIAGNOSIS — O3483 Maternal care for other abnormalities of pelvic organs, third trimester: Secondary | ICD-10-CM | POA: Diagnosis present

## 2020-10-12 DIAGNOSIS — Z23 Encounter for immunization: Secondary | ICD-10-CM

## 2020-10-12 DIAGNOSIS — D62 Acute posthemorrhagic anemia: Secondary | ICD-10-CM | POA: Diagnosis not present

## 2020-10-12 DIAGNOSIS — Z87891 Personal history of nicotine dependence: Secondary | ICD-10-CM | POA: Diagnosis not present

## 2020-10-12 DIAGNOSIS — O26893 Other specified pregnancy related conditions, third trimester: Secondary | ICD-10-CM | POA: Diagnosis present

## 2020-10-12 DIAGNOSIS — Z98891 History of uterine scar from previous surgery: Secondary | ICD-10-CM

## 2020-10-12 DIAGNOSIS — Z20822 Contact with and (suspected) exposure to covid-19: Secondary | ICD-10-CM | POA: Diagnosis present

## 2020-10-12 DIAGNOSIS — N838 Other noninflammatory disorders of ovary, fallopian tube and broad ligament: Secondary | ICD-10-CM | POA: Diagnosis present

## 2020-10-12 DIAGNOSIS — O9081 Anemia of the puerperium: Secondary | ICD-10-CM | POA: Diagnosis not present

## 2020-10-12 DIAGNOSIS — Z3A40 40 weeks gestation of pregnancy: Secondary | ICD-10-CM | POA: Diagnosis not present

## 2020-10-12 DIAGNOSIS — O99892 Other specified diseases and conditions complicating childbirth: Secondary | ICD-10-CM

## 2020-10-12 DIAGNOSIS — Z2839 Other underimmunization status: Secondary | ICD-10-CM

## 2020-10-12 DIAGNOSIS — O9902 Anemia complicating childbirth: Secondary | ICD-10-CM | POA: Diagnosis not present

## 2020-10-12 LAB — CBC
HCT: 35.4 % — ABNORMAL LOW (ref 36.0–46.0)
Hemoglobin: 11.7 g/dL — ABNORMAL LOW (ref 12.0–15.0)
MCH: 29.7 pg (ref 26.0–34.0)
MCHC: 33.1 g/dL (ref 30.0–36.0)
MCV: 89.8 fL (ref 80.0–100.0)
Platelets: 177 10*3/uL (ref 150–400)
RBC: 3.94 MIL/uL (ref 3.87–5.11)
RDW: 14.3 % (ref 11.5–15.5)
WBC: 12.5 10*3/uL — ABNORMAL HIGH (ref 4.0–10.5)
nRBC: 0 % (ref 0.0–0.2)

## 2020-10-12 LAB — TYPE AND SCREEN
ABO/RH(D): O POS
Antibody Screen: NEGATIVE

## 2020-10-12 LAB — RPR: RPR Ser Ql: NONREACTIVE

## 2020-10-12 SURGERY — Surgical Case
Anesthesia: Epidural

## 2020-10-12 MED ORDER — DIPHENHYDRAMINE HCL 50 MG/ML IJ SOLN
12.5000 mg | INTRAMUSCULAR | Status: DC | PRN
  Filled 2020-10-12: qty 1

## 2020-10-12 MED ORDER — CEFAZOLIN SODIUM-DEXTROSE 2-4 GM/100ML-% IV SOLN
INTRAVENOUS | Status: AC
  Filled 2020-10-12: qty 100

## 2020-10-12 MED ORDER — FENTANYL-BUPIVACAINE-NACL 0.5-0.125-0.9 MG/250ML-% EP SOLN
12.0000 mL/h | EPIDURAL | Status: DC | PRN
  Filled 2020-10-12: qty 250

## 2020-10-12 MED ORDER — SODIUM CHLORIDE (PF) 0.9 % IJ SOLN
INTRAMUSCULAR | Status: DC | PRN
  Administered 2020-10-12: 12 mL/h via EPIDURAL

## 2020-10-12 MED ORDER — NALBUPHINE HCL 10 MG/ML IJ SOLN: 5.0000 mg | Freq: Once | INTRAMUSCULAR | Status: DC | PRN

## 2020-10-12 MED ORDER — LIDOCAINE HCL (PF) 1 % IJ SOLN
INTRAMUSCULAR | Status: DC | PRN
  Administered 2020-10-12: 11 mL via EPIDURAL

## 2020-10-12 MED ORDER — SODIUM CHLORIDE 0.9 % IV SOLN: 250.0000 mL | INTRAVENOUS | Status: DC | PRN

## 2020-10-12 MED ORDER — SCOPOLAMINE 1 MG/3DAYS TD PT72
MEDICATED_PATCH | TRANSDERMAL | Status: DC | PRN
  Administered 2020-10-12: 1 via TRANSDERMAL

## 2020-10-12 MED ORDER — SODIUM CHLORIDE 0.9% FLUSH: 3.0000 mL | INTRAVENOUS | Status: DC | PRN

## 2020-10-12 MED ORDER — ACETAMINOPHEN 500 MG PO TABS: 1000.0000 mg | ORAL_TABLET | Freq: Four times a day (QID) | ORAL | Status: DC | PRN

## 2020-10-12 MED ORDER — FENTANYL CITRATE (PF) 100 MCG/2ML IJ SOLN
INTRAMUSCULAR | Status: AC
  Filled 2020-10-12: qty 2

## 2020-10-12 MED ORDER — MORPHINE SULFATE (PF) 0.5 MG/ML IJ SOLN
INTRAMUSCULAR | Status: DC | PRN
  Administered 2020-10-12: 3 mg via EPIDURAL

## 2020-10-12 MED ORDER — TERBUTALINE SULFATE 1 MG/ML IJ SOLN
0.2500 mg | Freq: Once | INTRAMUSCULAR | Status: AC
  Administered 2020-10-12: 0.25 mg via SUBCUTANEOUS

## 2020-10-12 MED ORDER — SODIUM CHLORIDE 0.9 % IR SOLN
Status: DC | PRN
  Administered 2020-10-12: 1000 mL

## 2020-10-12 MED ORDER — NALBUPHINE HCL 10 MG/ML IJ SOLN: 5.0000 mg | INTRAMUSCULAR | Status: DC | PRN

## 2020-10-12 MED ORDER — FENTANYL CITRATE (PF) 100 MCG/2ML IJ SOLN: 25.0000 ug | INTRAMUSCULAR | Status: DC | PRN

## 2020-10-12 MED ORDER — PHENYLEPHRINE 40 MCG/ML (10ML) SYRINGE FOR IV PUSH (FOR BLOOD PRESSURE SUPPORT)
80.0000 ug | PREFILLED_SYRINGE | INTRAVENOUS | Status: DC | PRN
  Filled 2020-10-12: qty 10

## 2020-10-12 MED ORDER — FENTANYL CITRATE (PF) 100 MCG/2ML IJ SOLN
INTRAMUSCULAR | Status: DC | PRN
Start: 2020-10-12 — End: 2020-10-12
  Administered 2020-10-12: 100 ug via INTRAVENOUS

## 2020-10-12 MED ORDER — MORPHINE SULFATE (PF) 0.5 MG/ML IJ SOLN
INTRAMUSCULAR | Status: AC
  Filled 2020-10-12: qty 10

## 2020-10-12 MED ORDER — ONDANSETRON HCL 4 MG/2ML IJ SOLN
4.0000 mg | Freq: Four times a day (QID) | INTRAMUSCULAR | Status: DC | PRN
  Administered 2020-10-12: 4 mg via INTRAVENOUS

## 2020-10-12 MED ORDER — TERBUTALINE SULFATE 1 MG/ML IJ SOLN: 0.2500 mg | Freq: Once | INTRAMUSCULAR | Status: DC | PRN

## 2020-10-12 MED ORDER — EPHEDRINE 5 MG/ML INJ: 10.0000 mg | INTRAVENOUS | Status: DC | PRN

## 2020-10-12 MED ORDER — ONDANSETRON HCL 4 MG/2ML IJ SOLN
INTRAMUSCULAR | Status: AC
  Filled 2020-10-12: qty 2

## 2020-10-12 MED ORDER — OXYTOCIN-SODIUM CHLORIDE 30-0.9 UT/500ML-% IV SOLN
1.0000 m[IU]/min | INTRAVENOUS | Status: DC
  Filled 2020-10-12: qty 500

## 2020-10-12 MED ORDER — DIPHENHYDRAMINE HCL 50 MG/ML IJ SOLN
50.0000 mg | Freq: Once | INTRAMUSCULAR | Status: AC
  Administered 2020-10-12: 50 mg via INTRAVENOUS

## 2020-10-12 MED ORDER — MEPERIDINE HCL 25 MG/ML IJ SOLN
INTRAMUSCULAR | Status: AC
  Filled 2020-10-12: qty 1

## 2020-10-12 MED ORDER — NALOXONE HCL 0.4 MG/ML IJ SOLN: 0.4000 mg | INTRAMUSCULAR | Status: DC | PRN

## 2020-10-12 MED ORDER — OXYTOCIN-SODIUM CHLORIDE 30-0.9 UT/500ML-% IV SOLN
INTRAVENOUS | Status: AC
  Filled 2020-10-12: qty 500

## 2020-10-12 MED ORDER — ONDANSETRON HCL 4 MG/2ML IJ SOLN: 4.0000 mg | Freq: Three times a day (TID) | INTRAMUSCULAR | Status: DC | PRN

## 2020-10-12 MED ORDER — LIDOCAINE-EPINEPHRINE (PF) 2 %-1:200000 IJ SOLN
INTRAMUSCULAR | Status: DC | PRN
  Administered 2020-10-12 (×3): 5 mL via EPIDURAL

## 2020-10-12 MED ORDER — TERBUTALINE SULFATE 1 MG/ML IJ SOLN
0.2500 mg | Freq: Once | INTRAMUSCULAR | Status: AC | PRN
  Administered 2020-10-12: 0.25 mg via SUBCUTANEOUS
  Filled 2020-10-12: qty 1

## 2020-10-12 MED ORDER — NALOXONE HCL 4 MG/10ML IJ SOLN
1.0000 ug/kg/h | INTRAVENOUS | Status: DC | PRN
  Filled 2020-10-12: qty 5

## 2020-10-12 MED ORDER — MEPERIDINE HCL 25 MG/ML IJ SOLN
6.2500 mg | INTRAMUSCULAR | Status: AC | PRN
  Administered 2020-10-12 (×2): 12.5 mg via INTRAVENOUS

## 2020-10-12 MED ORDER — LIDOCAINE HCL (PF) 1 % IJ SOLN: 30.0000 mL | INTRAMUSCULAR | Status: DC | PRN

## 2020-10-12 MED ORDER — OXYTOCIN BOLUS FROM INFUSION: 333.0000 mL | Freq: Once | INTRAVENOUS | Status: DC

## 2020-10-12 MED ORDER — OXYTOCIN-SODIUM CHLORIDE 30-0.9 UT/500ML-% IV SOLN
2.5000 [IU]/h | INTRAVENOUS | Status: DC
  Administered 2020-10-12: 30 [IU] via INTRAVENOUS

## 2020-10-12 MED ORDER — DEXAMETHASONE SODIUM PHOSPHATE 10 MG/ML IJ SOLN
INTRAMUSCULAR | Status: AC
  Filled 2020-10-12: qty 1

## 2020-10-12 MED ORDER — STERILE WATER FOR IRRIGATION IR SOLN
Status: DC | PRN
  Administered 2020-10-12: 1000 mL

## 2020-10-12 MED ORDER — LACTATED RINGERS IV SOLN
INTRAVENOUS | Status: DC
  Administered 2020-10-12: 125 mL/h via INTRAVENOUS

## 2020-10-12 MED ORDER — PHENYLEPHRINE HCL (PRESSORS) 10 MG/ML IV SOLN
INTRAVENOUS | Status: DC | PRN
  Administered 2020-10-12 (×2): 80 ug via INTRAVENOUS

## 2020-10-12 MED ORDER — OXYTOCIN 10 UNIT/ML IJ SOLN: 10.0000 [IU] | Freq: Once | INTRAMUSCULAR | Status: DC

## 2020-10-12 MED ORDER — SCOPOLAMINE 1 MG/3DAYS TD PT72
MEDICATED_PATCH | TRANSDERMAL | Status: AC
  Filled 2020-10-12: qty 1

## 2020-10-12 MED ORDER — KETOROLAC TROMETHAMINE 30 MG/ML IJ SOLN
30.0000 mg | Freq: Four times a day (QID) | INTRAMUSCULAR | Status: AC | PRN
  Administered 2020-10-12: 30 mg via INTRAVENOUS

## 2020-10-12 MED ORDER — LACTATED RINGERS AMNIOINFUSION: INTRAVENOUS | Status: DC

## 2020-10-12 MED ORDER — KETOROLAC TROMETHAMINE 30 MG/ML IJ SOLN: 30.0000 mg | Freq: Four times a day (QID) | INTRAMUSCULAR | Status: AC | PRN

## 2020-10-12 MED ORDER — LACTATED RINGERS IV SOLN
500.0000 mL | Freq: Once | INTRAVENOUS | Status: AC
  Administered 2020-10-12: 500 mL via INTRAVENOUS

## 2020-10-12 MED ORDER — METOCLOPRAMIDE HCL 5 MG/ML IJ SOLN
INTRAMUSCULAR | Status: DC | PRN
  Administered 2020-10-12: 10 mg via INTRAVENOUS

## 2020-10-12 MED ORDER — KETOROLAC TROMETHAMINE 30 MG/ML IJ SOLN
INTRAMUSCULAR | Status: AC
  Filled 2020-10-12: qty 1

## 2020-10-12 MED ORDER — CEFAZOLIN SODIUM-DEXTROSE 2-3 GM-%(50ML) IV SOLR
INTRAVENOUS | Status: DC | PRN
  Administered 2020-10-12: 2 g via INTRAVENOUS

## 2020-10-12 MED ORDER — FENTANYL CITRATE (PF) 100 MCG/2ML IJ SOLN: 50.0000 ug | INTRAMUSCULAR | Status: DC | PRN

## 2020-10-12 MED ORDER — DEXAMETHASONE SODIUM PHOSPHATE 10 MG/ML IJ SOLN
INTRAMUSCULAR | Status: DC | PRN
  Administered 2020-10-12: 10 mg via INTRAVENOUS

## 2020-10-12 MED ORDER — PHENYLEPHRINE 40 MCG/ML (10ML) SYRINGE FOR IV PUSH (FOR BLOOD PRESSURE SUPPORT)
PREFILLED_SYRINGE | INTRAVENOUS | Status: AC
  Filled 2020-10-12: qty 10

## 2020-10-12 MED ORDER — SODIUM CHLORIDE 0.9% FLUSH: 3.0000 mL | Freq: Two times a day (BID) | INTRAVENOUS | Status: DC

## 2020-10-12 MED ORDER — MISOPROSTOL 50MCG HALF TABLET
50.0000 ug | ORAL_TABLET | ORAL | Status: DC | PRN
  Administered 2020-10-12 (×2): 50 ug via BUCCAL
  Filled 2020-10-12 (×2): qty 1

## 2020-10-12 MED ORDER — METOCLOPRAMIDE HCL 5 MG/ML IJ SOLN
INTRAMUSCULAR | Status: AC
  Filled 2020-10-12: qty 2

## 2020-10-12 MED ORDER — DIPHENHYDRAMINE HCL 50 MG/ML IJ SOLN: 12.5000 mg | INTRAMUSCULAR | Status: DC | PRN

## 2020-10-12 MED ORDER — DIPHENHYDRAMINE HCL 25 MG PO CAPS
25.0000 mg | ORAL_CAPSULE | ORAL | Status: DC | PRN
  Administered 2020-10-13: 25 mg via ORAL
  Filled 2020-10-12: qty 1

## 2020-10-12 MED ORDER — SOD CITRATE-CITRIC ACID 500-334 MG/5ML PO SOLN
30.0000 mL | ORAL | Status: DC | PRN
  Administered 2020-10-12: 30 mL via ORAL
  Filled 2020-10-12: qty 15

## 2020-10-12 MED ORDER — PHENYLEPHRINE 40 MCG/ML (10ML) SYRINGE FOR IV PUSH (FOR BLOOD PRESSURE SUPPORT): 80.0000 ug | PREFILLED_SYRINGE | INTRAVENOUS | Status: DC | PRN

## 2020-10-12 SURGICAL SUPPLY — 38 items
BENZOIN TINCTURE PRP APPL 2/3 (GAUZE/BANDAGES/DRESSINGS) ×3 IMPLANT
CHLORAPREP W/TINT 26ML (MISCELLANEOUS) ×3 IMPLANT
CLAMP CORD UMBIL (MISCELLANEOUS) IMPLANT
CLOSURE WOUND 1/2 X4 (GAUZE/BANDAGES/DRESSINGS) ×1
CLOTH BEACON ORANGE TIMEOUT ST (SAFETY) ×3 IMPLANT
DRSG OPSITE POSTOP 4X10 (GAUZE/BANDAGES/DRESSINGS) ×3 IMPLANT
ELECT REM PT RETURN 9FT ADLT (ELECTROSURGICAL) ×3
ELECTRODE REM PT RTRN 9FT ADLT (ELECTROSURGICAL) ×1 IMPLANT
EXTRACTOR VACUUM KIWI (MISCELLANEOUS) ×3 IMPLANT
GLOVE BIOGEL PI IND STRL 6.5 (GLOVE) ×1 IMPLANT
GLOVE BIOGEL PI IND STRL 7.0 (GLOVE) ×2 IMPLANT
GLOVE BIOGEL PI INDICATOR 6.5 (GLOVE) ×2
GLOVE BIOGEL PI INDICATOR 7.0 (GLOVE) ×4
GLOVE ECLIPSE 6.5 STRL STRAW (GLOVE) ×3 IMPLANT
GOWN STRL REUS W/TWL LRG LVL3 (GOWN DISPOSABLE) ×6 IMPLANT
HEMOSTAT ARISTA ABSORB 3G PWDR (HEMOSTASIS) ×3 IMPLANT
KIT ABG SYR 3ML LUER SLIP (SYRINGE) IMPLANT
NEEDLE HYPO 25X5/8 SAFETYGLIDE (NEEDLE) IMPLANT
NS IRRIG 1000ML POUR BTL (IV SOLUTION) ×3 IMPLANT
PACK C SECTION WH (CUSTOM PROCEDURE TRAY) ×3 IMPLANT
PAD OB MATERNITY 4.3X12.25 (PERSONAL CARE ITEMS) ×3 IMPLANT
PENCIL SMOKE EVAC W/HOLSTER (ELECTROSURGICAL) ×3 IMPLANT
RETRACTOR WND ALEXIS 25 LRG (MISCELLANEOUS) IMPLANT
RTRCTR WOUND ALEXIS 25CM LRG (MISCELLANEOUS)
STRIP CLOSURE SKIN 1/2X4 (GAUZE/BANDAGES/DRESSINGS) ×2 IMPLANT
SUT MNCRL 0 VIOLET CTX 36 (SUTURE) ×2 IMPLANT
SUT MONOCRYL 0 CTX 36 (SUTURE) ×4
SUT PLAIN 2 0 XLH (SUTURE) IMPLANT
SUT VIC AB 0 CT1 36 (SUTURE) ×6 IMPLANT
SUT VIC AB 3-0 CT1 27 (SUTURE)
SUT VIC AB 3-0 CT1 TAPERPNT 27 (SUTURE) IMPLANT
SUT VIC AB 3-0 SH 27 (SUTURE) ×2
SUT VIC AB 3-0 SH 27X BRD (SUTURE) ×1 IMPLANT
SUT VIC AB 4-0 KS 27 (SUTURE) ×3 IMPLANT
SUT VIC AB 4-0 PS2 27 (SUTURE) ×3 IMPLANT
TOWEL OR 17X24 6PK STRL BLUE (TOWEL DISPOSABLE) ×3 IMPLANT
TRAY FOLEY W/BAG SLVR 14FR LF (SET/KITS/TRAYS/PACK) IMPLANT
WATER STERILE IRR 1000ML POUR (IV SOLUTION) ×3 IMPLANT

## 2020-10-12 NOTE — Anesthesia Postprocedure Evaluation (Signed)
Anesthesia Post Note  Patient: [name] [name]  Procedure(s) Performed: CESAREAN SECTION (N/A )     Patient location during evaluation: PACU Anesthesia Type: Epidural Level of consciousness: oriented and awake and alert Pain management: pain level controlled Vital Signs Assessment: post-procedure vital signs reviewed and stable Respiratory status: spontaneous breathing, respiratory function stable and nonlabored ventilation Cardiovascular status: blood pressure returned to baseline and stable Postop Assessment: no headache, no backache, no apparent nausea or vomiting, epidural receding and patient able to bend at knees Anesthetic complications: no   No complications documented.  Last Vitals:  Vitals:   10/12/20 2315 10/12/20 2318  BP: (!) 105/58   Pulse: 92 100  Resp: 17 20  Temp:    SpO2: 100% 98%    Last Pain:  Vitals:   10/12/20 2310  TempSrc:   PainSc: 0-No pain   Pain Goal:                Epidural/Spinal Function Cutaneous sensation: Tingles (10/12/20 2310), Patient able to flex knees: Yes (10/12/20 2310), Patient able to lift hips off bed: Yes (10/12/20 2310), Back pain beyond tenderness at insertion site: No (10/12/20 2310), Progressively worsening motor and/or sensory loss: No (10/12/20 2310), Bowel and/or bladder incontinence post epidural: No (10/12/20 2310)  Mylik Pro A.

## 2020-10-12 NOTE — Progress Notes (Addendum)
S: Comfortable with epidural. Family at the bedside.   O: Vitals:   10/12/20 1633 10/12/20 1700 10/12/20 1730 10/12/20 1800  BP:  120/69 124/76 119/73  Pulse:  87 80 79  Resp:   18 18  Temp: 98.8 F (37.1 C)   98.8 F (37.1 C)  TempSrc: Oral   Oral  SpO2:      Weight:      Height:       FHT:  FHR: 150 bpm, variability: moderate,  accelerations:  Present,  decelerations:  Present At 1928, FHR with repetitive late decelerations, with most down to 130s and some to 90s UC:   regular, every 1.5-2.5 minutes SVE:   Dilation: 6 Effacement (%): 70 Station: -2 Exam by:: Jones, CNM  A / P: Induction of labor due to elective at term, s/p outpatient Foley balloon, 2 doses of buccal Cytotec  Fetal Wellbeing:  Category II Pain Control:  Epidural Anticipated MOD:  Primary cesarean section for fetal intolerance to labor   Repetitive late decelerations started at 1928. Despite position changes, IVF bolus, and amnioinfusion, the late decelerations persisted. At 2017 a FSE was placed. At 2029, Dr. Amado Nash was notified of FHR tracing and reviewed the tracing remotely. Notified CNM that she was en route to hospital for primary cesarean section.   Risk of C/S reviewed including infection, bleeding, possible need for blood transfusion and the risk including HIV, hepatitis, fever/rash, injury to surrounding organ structures and internal scar tissue. All questions answered.  Dr. Amado Nash en route to hospital.   June Leap, CNM, MSN 10/12/2020, 9:00 PM   I have discussed the patient with CNM and agree with this note.   FHT: 150s, normal variability, some periods decreased variability; repetative decels to 130s-90s; pt has had terbutaline and decels have returned and continued despite corrective measures Toco: q min  A/P: Iup at 40 wga 1. NRFHT - I have spoken with patient and husband about fetal status and recommendation to proceed with primary ltcs.  Risks reviewed: bleeding (possible need  for transfusion), infection, risk of injury to bowel/bladder/nerves/blood vessels, risk of anesthesia, risk of further surgery, risk of blood clot to leg/lung; patient agrees to proceed to OR, consent signed.

## 2020-10-12 NOTE — Progress Notes (Signed)
S: Called by RN for FHR deceleration.    O: Vitals:   10/12/20 1633 10/12/20 1700 10/12/20 1730 10/12/20 1800  BP:  120/69 124/76 119/73  Pulse:  87 80 79  Resp:   18 18  Temp: 98.8 F (37.1 C)   98.8 F (37.1 C)  TempSrc: Oral   Oral  SpO2:      Weight:      Height:       FHT:  FHR: 125 bpm, variability: moderate,  accelerations: none, decelerations: From 1594-5859, repetitive late decelerations to 60s lasting 60-90 seconds with return to baseline of 125 UC:   regular, every 2 minutes SVE:   Dilation: 6 Effacement (%): 70, with swelling Station: -2 Exam by:: Dorisann Frames CNM   Position changes, IVF bolus, and amnioinfusion running. Terbutaline given. At 1845, variables resolved and patient was placed in high Fowler's position.   A / P: Induction of labor due to elective at term, s/p outpatient Foley balloon, 2 doses of buccal Cytotec  Fetal Wellbeing:  Category II Pain Control:  Epidural Anticipated MOD:  NSVD   Will wait 30 minutes and if FHR is reactive and reassuring, will start Pitocin 2x2 until adequate contractions.   Continue amnioinfusion.   Dr. Amado Nash updated on decelerations of FHR, patient status, and plan of care.   June Leap, CNM, MSN 10/12/2020, 7:19 PM

## 2020-10-12 NOTE — Op Note (Signed)
Cesarean Section Procedure Note   [name] [name]  10/12/2020  Indications: Fetal Distress   Pre-operative Diagnosis: ceserean for fetal intolerance to labor.   Post-operative Diagnosis: Same , remote from delivery; paratubal cyst Procedure: 1ltcs, excision right paratubal cyst Surgeon: Surgeon(s) and Role:    * Franko Hilliker, Candace Gallus, MD - Primary   Assistants: Dorisann Frames, CNM  Anesthesia: epidural   Procedure Details:  The patient was seen in the Labor Room. The risks, benefits, complications, treatment options, and expected outcomes were discussed with the patient. The patient concurred with the proposed plan, giving informed consent. identified as [name] [name] and the procedure verified as C-Section Delivery. A Time Out was held and the above information confirmed.  After induction of anesthesia, the patient was draped and prepped in the usual sterile manner, foley was draining urine well.  A pfannenstiel incision was made and carried down through the subcutaneous tissue to the fascia. Fascial incision was made and extended transversely. The fascia was separated from the underlying rectus tissue superiorly and inferiorly. The peritoneum was identified and entered. Peritoneal incision was extended longitudinally. Alexis-O retractor placed. The utero-vesical peritoneal reflection was incised transversely and the bladder flap was bluntly freed from the lower uterine segment. A low transverse uterine incision was made. Delivered from cephalic presentation (ROT, hyper extended) was a viable female infant with vigorous cry. Apgar scores of 8 at one minute and 9 at five minutes. Delayed cord clamping done at 1 minute and baby handed to NICU team in attendance. Cord ph was not sent. Cord blood was obtained for evaluation. The placenta was removed Intact, spontaneously and appeared normal. The uterine outline, tubes and ovaries appeared normal}. There was a right paratubal cyst on a long thin  stalk - this was ligated near the base with 0 vicryl, blanching noted.  The pedicle was then transected with the bovie and hemostasis noted. The uterine incision was closed with running locked sutures of . A second imbricating layer sutured.   Hemostasis was observed. Arista was applied to the LUS to ensure excellent hemostasis.  Alexis retractor removed. Good hemostasis was noted. The fascia was then reapproximated with running sutures of 0Vicryl.  The skin was closed with 4-0Vicryl.   Instrument, sponge, and needle counts were correct prior the abdominal closure and were correct at the conclusion of the case.    Findings: viable female infant, apgars 8/9; small right paratubal cyst; nml appearing uterus and ovaries bilaterally   Estimated Blood Loss:   Total IV Fluids:   Urine Output:  100CC OF clear urine  Specimens: right paratubal cyst; placenta Complications: no complications  Disposition: PACU - hemodynamically stable.   Maternal Condition: stable   Baby condition / location:  Couplet care / Skin to Skin  Attending Attestation: I performed the procedure.   Signed: Surgeon(s): Amado Nash Candace Gallus, MD

## 2020-10-12 NOTE — Progress Notes (Signed)
S: Comfortable with epidural. Mother, Shawna Orleans, at the bedside providing support.   O: Vitals:   10/12/20 1630 10/12/20 1633 10/12/20 1700 10/12/20 1730  BP: 120/71  120/69 124/76  Pulse: 83  87 80  Resp:    18  Temp:  98.8 F (37.1 C)    TempSrc:  Oral    SpO2:      Weight:      Height:       FHT:  FHR: 125 bpm, variability: moderate,  accelerations:  Present,  decelerations:  Absent UC:   regular, every 2 minutes SVE:   Dilation: 5 Effacement (%): 70, with swelling Station: -2 Exam by:: Dorisann Frames CNM   IUPC placed without difficulty.  A / P: Induction of labor due to elective at term, s/p outpatient Foley balloon, 2 doses of buccal Cytotec  Fetal Wellbeing:  Category I Pain Control:  Epidural Anticipated MOD:  NSVD   SVE unchanged with contractions every 2 minutes. IUPC placed and will assess MVUs for 30 minutes. If inadequate, will start Pitocin 2x2.   Give Benadryl 50mg  IV for cervical swelling.   Start amnioinfusion due to thick meconium.    , CNM, MSN 10/12/2020, 6:13 PM

## 2020-10-12 NOTE — Transfer of Care (Signed)
Immediate Anesthesia Transfer of Care Note  Patient: [name] [name]  Procedure(s) Performed: CESAREAN SECTION (N/A )  Patient Location: PACU  Anesthesia Type:Epidural  Level of Consciousness: awake, alert , oriented and patient cooperative  Airway & Oxygen Therapy: Patient Spontanous Breathing  Post-op Assessment: Report given to RN and Post -op Vital signs reviewed and stable  Post vital signs: Reviewed and stable  Last Vitals:  Vitals Value Taken Time  BP 112/63 10/12/20 2238  Temp    Pulse 102 10/12/20 2240  Resp 17 10/12/20 2240  SpO2 100 % 10/12/20 2240  Vitals shown include unvalidated device data.  Last Pain:  Vitals:   10/12/20 1943  TempSrc:   PainSc: 0-No pain         Complications: No complications documented.

## 2020-10-12 NOTE — Progress Notes (Signed)
S: Comfortable with epidural. Discussed the R/B/A of AROM for induction and patient consents to procedure.   O: Vitals:   10/12/20 1620 10/12/20 1625 10/12/20 1630 10/12/20 1633  BP: 120/72 122/68 120/71   Pulse: 73 81 83   Resp: 20     Temp:    98.8 F (37.1 C)  TempSrc:    Oral  SpO2: 100%     Weight:      Height:       FHT:  FHR: 130 bpm, variability: moderate,  accelerations:  Present,  decelerations:  Absent UC:   regular, every 2 minutes SVE:   Dilation: 5 Effacement (%): 80 Station: -2 Exam by:: Dorisann Frames CNM  AROM of thick meconium fluid @ 1633  A / P: Induction of labor due to elective at term, s/p outpatient Foley balloon, 2 doses of buccal Cytotec  Fetal Wellbeing:  Category I Pain Control:  Epidural Anticipated MOD:  NSVD   Will recheck SVE in 3 hours and if no progress will start Pitocin 2x2 until adequate contractions.  [name] [name], CNM, MSN 10/12/2020, 4:50 PM

## 2020-10-12 NOTE — H&P (Signed)
OB ADMISSION/ HISTORY & PHYSICAL:  Admission Date: 10/12/2020  8:08 AM  Admit Diagnosis: Encounter for induction of labor [Z34.90]    [name] [name] is a 24 y.o. female presenting for IOL at term. Patient was seen yesterday in the office and had a Foley balloon placed for cervical ripening. Reports contractions overnight with some bloody show. Denies leaking of fluid. Endorses + fetal movement. Mother, Shawna Orleans, at the bedside providing support. Expecting a baby girl, "[name] [name]".   Prenatal History: G1P0   EDC : 10/12/2020 Prenatal care at [facility] since 9 weeks, primary A. Yona Kosek CNM  Prenatal course complicated by: 1. Anemia, on PO Niferex 2. History of drug abuse, negative UDS during pregnancy x 2 3. Bipolar - stable off medication 4. Bleeding in pregnancy @ 32 weeks, no identifiable cause  Prenatal Labs: ABO, Rh: O (04/08 0000)  Antibody: PENDING (11/05 4709) Rubella: Nonimmune (04/08 0000)  RPR: Nonreactive (04/08 0000)  HBsAg: Negative (04/08 0000)  HIV: Non-reactive (04/08 0000)  GBS: Negative/-- (10/11 0000)  1 hr Glucola : 130 Genetic Screening: Declined Ultrasound: normal XX anatomy, anterior placenta, last growth U/S 69% at 33 weeks  TDAP: UTD FLU: Declined COVID-19: Declined    Maternal Diabetes: No Genetic Screening: Declined Maternal Ultrasounds/Referrals: Normal Fetal Ultrasounds or other Referrals:  None Maternal Substance Abuse:  No, hx of cocaine use in 2017-18, no recent use, negative UDS x 2 in pregnancy Significant Maternal Medications:  None Significant Maternal Lab Results:  Group B Strep negative Other Comments:  None  Medical / Surgical History :  Past medical history:  Past Medical History:  Diagnosis Date   Abnormal Pap smear of cervix    08-05-17 ASCUS HPV HR+, no procedure due to age   Bipolar 1 disorder, depressed (HCC) 01/2016   Irregular menstrual cycle 06/19/2014   Mild major depression, 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     Past surgical history:  Past Surgical History:  Procedure Laterality Date   MOUTH SURGERY  4 years ago    NO PAST SURGERIES      Family History:  Family History  Problem Relation Age of Onset   Prostate cancer Father    Diabetes Mellitus II Father    Hypertension Father    Diabetes Mellitus II Paternal Grandmother    Depression Paternal Grandmother    Hypertension Paternal Grandmother    Diabetes Mellitus II Paternal Grandfather    Hypertension Paternal Grandfather    Fibroids Maternal Grandmother    Osteoporosis Maternal Grandmother     Social History:  reports that she has quit smoking. Her smoking use included cigarettes. She has never used smokeless tobacco. She reports previous alcohol use. She reports previous drug use.  Allergies: Amoxicillin   Current Medications at time of admission:  Medications Prior to Admission  Medication Sig Dispense Refill Last Dose   ferrous sulfate 325 (65 FE) MG EC tablet Take 325 mg by mouth daily with breakfast.       metroNIDAZOLE (FLAGYL) 500 MG tablet Take 1 tablet (500 mg total) by mouth 2 (two) times daily. 14 tablet 0    Prenatal Vit-Fe Fumarate-FA (MULTIVITAMIN-PRENATAL) 27-0.8 MG TABS tablet Take 1 tablet by mouth daily at 12 noon.       Review of Systems: Review of Systems  All other systems reviewed and are negative.  Physical Exam: Vital signs and nursing notes reviewed.  Patient Vitals for the past 24 hrs:  BP Temp Temp src Pulse Resp Height Weight  10/12/20 0826 124/84 99.6 F (37.6 C) Oral 98 16 5\' 5"  (1.651 m) 70.9 kg    General: AAO x 3, NAD Heart: RRR Lungs:CTAB Abdomen: Gravid, NT, Leopold's vertex Extremities: no edema Genitalia / VE: Dilation: 3.5 Effacement (%): 60 Cervical Position: Middle Station: -2 Presentation:  Vertex Exam by:: [email or web address] RN   Foley balloon removed by RN without difficulty  FHR: 135BPM, mod variability, + accels, no decels TOCO: Contractions occasional  Labs:   Pending T&S, CBC, RPR  Recent Labs    10/12/20 0842  WBC 12.5*  HGB 11.7*  HCT 35.4*  PLT 177    Assessment:  24 y.o. G1P0 at [redacted]w[redacted]d, elective IOL  1. Induction of labor 2. FHR category 1 3. GBS negative 4. Desires hydrotherapy, open to epidural 5. Plans to breastfeed 6. Bipolar, stable off meds 7. History of cocaine abuse, negative UDS during pregnancy  Plan:  1. Admit to [facility] 2. Routine L&D orders 3. Analgesia/anesthesia PRN  4. Cytotec [redacted]w[redacted]d buccal for cervical ripening 5. Anticipate NSVB 6. Close f/u postpartum for mood disorder  Dr. notified of admission / plan of care  Juliene Pina CNM, MSN 10/12/2020, 9:11 AM

## 2020-10-12 NOTE — Anesthesia Preprocedure Evaluation (Addendum)
Anesthesia Evaluation  Patient identified by MRN, date of birth, ID band Patient awake    Reviewed: Allergy & Precautions, NPO status , Patient's Chart, lab work & pertinent test results  Airway Mallampati: II  TM Distance: >3 FB Neck ROM: Full    Dental no notable dental hx. (+) Teeth Intact   Pulmonary neg pulmonary ROS, former smoker,    Pulmonary exam normal breath sounds clear to auscultation       Cardiovascular negative cardio ROS Normal cardiovascular exam Rhythm:Regular Rate:Normal     Neuro/Psych negative neurological ROS  negative psych ROS   GI/Hepatic negative GI ROS, Neg liver ROS,   Endo/Other  negative endocrine ROS  Renal/GU negative Renal ROS  negative genitourinary   Musculoskeletal negative musculoskeletal ROS (+)   Abdominal   Peds negative pediatric ROS (+)  Hematology negative hematology ROS (+)   Anesthesia Other Findings   Reproductive/Obstetrics (+) Pregnancy                            Anesthesia Physical Anesthesia Plan  ASA: II and emergent  Anesthesia Plan: Epidural   Post-op Pain Management:    Induction:   PONV Risk Score and Plan: 4 or greater and Scopolamine patch - Pre-op and Treatment may vary due to age or medical condition  Airway Management Planned: Natural Airway  Additional Equipment: None  Intra-op Plan:   Post-operative Plan:   Informed Consent: I have reviewed the patients History and Physical, chart, labs and discussed the procedure including the risks, benefits and alternatives for the proposed anesthesia with the patient or authorized representative who has indicated his/her understanding and acceptance.     Dental advisory given  Plan Discussed with: CRNA and Anesthesiologist  Anesthesia Plan Comments: (Patient for C/Section for non reassuring FHR tracing. Will use epidural for C/Section. M. Malen Gauze, MD)      Anesthesia  Quick Evaluation

## 2020-10-12 NOTE — Anesthesia Procedure Notes (Signed)
Epidural Patient location during procedure: OB Start time: 10/12/2020 3:43 PM End time: 10/12/2020 3:57 PM  Staffing Anesthesiologist: Lowella Curb, MD Performed: anesthesiologist   Preanesthetic Checklist Completed: patient identified, IV checked, site marked, risks and benefits discussed, surgical consent, monitors and equipment checked, pre-op evaluation and timeout performed  Epidural Patient position: sitting Prep: ChloraPrep Patient monitoring: heart rate, cardiac monitor, continuous pulse ox and blood pressure Approach: midline Location: L2-L3 Injection technique: LOR saline  Needle:  Needle type: Tuohy  Needle gauge: 17 G Needle length: 9 cm Needle insertion depth: 6 cm Catheter type: closed end flexible Catheter size: 20 Guage Catheter at skin depth: 10 cm Test dose: negative  Assessment Events: blood not aspirated, injection not painful, no injection resistance, no paresthesia and negative IV test  Additional Notes Reason for block:procedure for pain

## 2020-10-12 NOTE — Progress Notes (Signed)
Pt declines IV placement at this time. CNM aware.

## 2020-10-13 ENCOUNTER — Encounter (HOSPITAL_COMMUNITY): Payer: Self-pay | Admitting: Obstetrics & Gynecology

## 2020-10-13 DIAGNOSIS — O9902 Anemia complicating childbirth: Secondary | ICD-10-CM | POA: Diagnosis not present

## 2020-10-13 DIAGNOSIS — O99892 Other specified diseases and conditions complicating childbirth: Secondary | ICD-10-CM

## 2020-10-13 DIAGNOSIS — Z2839 Other underimmunization status: Secondary | ICD-10-CM

## 2020-10-13 HISTORY — DX: Anemia complicating childbirth: O99.02

## 2020-10-13 LAB — CBC
HCT: 27.6 % — ABNORMAL LOW (ref 36.0–46.0)
Hemoglobin: 9 g/dL — ABNORMAL LOW (ref 12.0–15.0)
MCH: 29.9 pg (ref 26.0–34.0)
MCHC: 32.6 g/dL (ref 30.0–36.0)
MCV: 91.7 fL (ref 80.0–100.0)
Platelets: 163 10*3/uL (ref 150–400)
RBC: 3.01 MIL/uL — ABNORMAL LOW (ref 3.87–5.11)
RDW: 14.7 % (ref 11.5–15.5)
WBC: 15.6 10*3/uL — ABNORMAL HIGH (ref 4.0–10.5)
nRBC: 0 % (ref 0.0–0.2)

## 2020-10-13 MED ORDER — OXYTOCIN-SODIUM CHLORIDE 30-0.9 UT/500ML-% IV SOLN
2.5000 [IU]/h | INTRAVENOUS | Status: AC
  Administered 2020-10-13: 2.5 [IU]/h via INTRAVENOUS

## 2020-10-13 MED ORDER — WITCH HAZEL-GLYCERIN EX PADS: 1.0000 "application " | MEDICATED_PAD | CUTANEOUS | Status: DC | PRN

## 2020-10-13 MED ORDER — KETOROLAC TROMETHAMINE 30 MG/ML IJ SOLN
30.0000 mg | Freq: Four times a day (QID) | INTRAMUSCULAR | Status: AC
  Administered 2020-10-13 (×4): 30 mg via INTRAVENOUS
  Filled 2020-10-13 (×4): qty 1

## 2020-10-13 MED ORDER — ACETAMINOPHEN 500 MG PO TABS
1000.0000 mg | ORAL_TABLET | Freq: Four times a day (QID) | ORAL | Status: DC
  Administered 2020-10-13 – 2020-10-14 (×7): 1000 mg via ORAL
  Filled 2020-10-13 (×7): qty 2

## 2020-10-13 MED ORDER — OXYCODONE HCL 5 MG PO TABS
5.0000 mg | ORAL_TABLET | ORAL | Status: DC | PRN
  Administered 2020-10-13: 5 mg via ORAL
  Administered 2020-10-13 – 2020-10-14 (×5): 10 mg via ORAL
  Filled 2020-10-13: qty 1
  Filled 2020-10-13 (×6): qty 2

## 2020-10-13 MED ORDER — PRENATAL MULTIVITAMIN CH
1.0000 | ORAL_TABLET | Freq: Every day | ORAL | Status: DC
  Administered 2020-10-13 – 2020-10-14 (×2): 1 via ORAL
  Filled 2020-10-13 (×2): qty 1

## 2020-10-13 MED ORDER — SENNOSIDES-DOCUSATE SODIUM 8.6-50 MG PO TABS
2.0000 | ORAL_TABLET | ORAL | Status: DC
  Administered 2020-10-13: 2 via ORAL
  Filled 2020-10-13 (×2): qty 2

## 2020-10-13 MED ORDER — MAGNESIUM OXIDE 400 (241.3 MG) MG PO TABS
400.0000 mg | ORAL_TABLET | Freq: Every day | ORAL | Status: DC
  Administered 2020-10-13 – 2020-10-14 (×2): 400 mg via ORAL
  Filled 2020-10-13 (×2): qty 1

## 2020-10-13 MED ORDER — DIBUCAINE (PERIANAL) 1 % EX OINT: 1.0000 "application " | TOPICAL_OINTMENT | CUTANEOUS | Status: DC | PRN

## 2020-10-13 MED ORDER — DIPHENHYDRAMINE HCL 25 MG PO CAPS: 25.0000 mg | ORAL_CAPSULE | Freq: Four times a day (QID) | ORAL | Status: DC | PRN

## 2020-10-13 MED ORDER — MENTHOL 3 MG MT LOZG: 1.0000 | LOZENGE | OROMUCOSAL | Status: DC | PRN

## 2020-10-13 MED ORDER — SIMETHICONE 80 MG PO CHEW: 80.0000 mg | CHEWABLE_TABLET | ORAL | Status: DC | PRN

## 2020-10-13 MED ORDER — POLYSACCHARIDE IRON COMPLEX 150 MG PO CAPS
150.0000 mg | ORAL_CAPSULE | Freq: Every day | ORAL | Status: DC
  Administered 2020-10-13 – 2020-10-14 (×2): 150 mg via ORAL
  Filled 2020-10-13 (×2): qty 1

## 2020-10-13 MED ORDER — SIMETHICONE 80 MG PO CHEW
80.0000 mg | CHEWABLE_TABLET | ORAL | Status: DC
  Administered 2020-10-13: 80 mg via ORAL
  Filled 2020-10-13 (×2): qty 1

## 2020-10-13 MED ORDER — TETANUS-DIPHTH-ACELL PERTUSSIS 5-2.5-18.5 LF-MCG/0.5 IM SUSY: 0.5000 mL | PREFILLED_SYRINGE | Freq: Once | INTRAMUSCULAR | Status: DC

## 2020-10-13 MED ORDER — MEASLES, MUMPS & RUBELLA VAC IJ SOLR
0.5000 mL | Freq: Once | INTRAMUSCULAR | Status: AC | PRN
  Administered 2020-10-14: 0.5 mL via SUBCUTANEOUS
  Filled 2020-10-13: qty 0.5

## 2020-10-13 MED ORDER — IBUPROFEN 800 MG PO TABS
800.0000 mg | ORAL_TABLET | Freq: Four times a day (QID) | ORAL | Status: DC
  Administered 2020-10-13 – 2020-10-14 (×3): 800 mg via ORAL
  Filled 2020-10-13 (×3): qty 1

## 2020-10-13 MED ORDER — LACTATED RINGERS IV SOLN: INTRAVENOUS | Status: DC

## 2020-10-13 MED ORDER — SIMETHICONE 80 MG PO CHEW
80.0000 mg | CHEWABLE_TABLET | Freq: Three times a day (TID) | ORAL | Status: DC
  Administered 2020-10-13 – 2020-10-14 (×4): 80 mg via ORAL
  Filled 2020-10-13 (×4): qty 1

## 2020-10-13 MED ORDER — COCONUT OIL OIL: 1.0000 "application " | TOPICAL_OIL | Status: DC | PRN

## 2020-10-13 NOTE — Progress Notes (Signed)
Subjective: POD# 1 Live born female  Birth Weight: 8 lb 1.6 oz (3674 g) APGAR: 8, 9  Newborn Delivery   Birth date/time: 10/12/2020 21:45:00 Delivery type: C-Section, Low Transverse Trial of labor: Yes C-section categorization: Primary     Baby name: Reiley Delivering provider: Tiana Loft E   Feeding: breast  Pain control at delivery: Epidural   Reports feeling well  Patient reports tolerating PO.   Breast symptoms:+ colostrum Pain controlled with PO meds Denies HA/SOB/C/P/N/V/dizziness. Flatus present. She reports vaginal bleeding as normal, without clots.  She has stood at [facility], Foley cath still in place.  Objective:   VS:    Vitals:   10/13/20 0258 10/13/20 0552 10/13/20 0822 10/13/20 0933  BP: 100/68 (!) 97/51 (!) 108/51   Pulse: 83 71 77   Resp: '18 16 18   ' Temp: 99.8 F (37.7 C) 99.6 F (37.6 C) 99.4 F (37.4 C)   TempSrc: Oral Oral Oral   SpO2: 100% 100% 98% 100%  Weight:      Height:          Intake/Output Summary (Last 24 hours) at 10/13/2020 1118 Last data filed at 10/13/2020 0827 Gross per 24 hour  Intake 1000 ml  Output 2532 ml  Net -1532 ml        Recent Labs    10/12/20 0842 10/13/20 0446  WBC 12.5* 15.6*  HGB 11.7* 9.0*  HCT 35.4* 27.6*  PLT 177 163     Blood type: --/--/O POS (11/05 3374)  Rubella: Nonimmune (04/08 0000)  TDAP: UTD FLU: Declined COVID-19: Declined   Physical Exam:  General: alert, cooperative and no distress CV: Regular rate and rhythm Resp: clear Abdomen: soft, nontender, normal bowel sounds Incision: intact and serous and 1/3 of dressing soiled drainage present Uterine Fundus: firm, below umbilicus, nontender Lochia: minimal Ext: no edema, redness or tenderness in the calves or thighs      Assessment/Plan: 24 y.o.   POD# 1. G1P1001                  Principal Problem:   Postpartum care following cesarean delivery 11/5 Active Problems:   Encounter for induction of labor   Failed induction of  labor - FITL   Status post primary low transverse cesarean section 11/5   Maternal anemia, with delivery - ABL  - started oral Fe and Mag Ox Rubella non-immune  - offer MMR prior to DC  Doing well, stable.        DC foley today       Advance diet as tolerated Encourage rest when baby rests Breastfeeding support Encourage to ambulate Routine post-op care  Juliene Pina, CNM, MSN 10/13/2020, 11:18 AM

## 2020-10-13 NOTE — Lactation Note (Addendum)
This note was copied from a baby's chart. Lactation Consultation Note  Patient Name: [name] [name] EUMPN'T Date: 10/13/2020 Reason for consult: Follow-up assessment    Mother is a P36, infant is 16 hours old and is now at % wt loss.  Infant has has several feedings today with hand expressed milk using a spoon. No sustained latches.   Mother has been hand expressing and using the hand pump . She reports that she was unable to get any colostrum the last time she hand expressed.  Staff nurse hand been helping her.  Assist with hand expression and collected 4 ml from one breast.   Assist mother with latching infant to the breast in football hold. Infant latched on and off for several mins.  Mother taught the asymmetrical latch. Infant latched much better. Lips were wider but then lower lip rolled back upward.  This is a difficult latch. Mother continuing to breast feed on the left breast.   I sat up a DEBP for mother to start using after each breast feeding attempt. Staff member will assist her after this feeding. Mother was taught to use the pump and informed how to clean parts. And collect milk.   Cue base feed and then hand express Pump using a DEBP after each feeding for 15-20 mins.   Mother to continue to cue base feed infant and feed at least 8-12 times or more in 24 hours and advised to allow for cluster feeding infant as needed.   Mother to continue to due STS. Mother is aware of available LC services at [facility], BFSG'S, OP Dept, and phone # for questions or concerns about breastfeeding.  Mother receptive to all teaching and plan of care.     Maternal Data    Feeding Feeding Type: Breast Fed  LATCH Score                   Interventions    Lactation Tools Discussed/Used Pump Review: Setup, frequency, and cleaning;Milk Storage Initiated by:: Stevan Born RN,IBCLC Date initiated:: 10/13/20   Consult Status      Stevan Born McCoy 10/13/2020,  3:10 PM

## 2020-10-13 NOTE — Clinical Social Work Maternal (Signed)
CLINICAL SOCIAL WORK MATERNAL/CHILD NOTE  Patient Details  Name: [name] [name] MRN: [id number] Date of Birth: 1996-05-13  Date:  10/13/2020  Clinical Social Worker Initiating Note:  Hortencia Pilar, LCSW Date/Time: Initiated:  10/13/20/0900     Child's Name:  [facility]   Biological Parents:  Mother, Father Niblack [place], Emogene Morgan)   Need for Interpreter:  None   Reason for Referral:  Behavioral Health Concerns   Address:  [address] [place] [address]    Phone number:  [phone] (home)     Additional phone number: none   Household Members/Support Persons (HM/SP):   Household Member/Support Person 1, Household Member/Support Person 2   HM/SP Name Relationship DOB or Age  HM/SP -1  Camesha Farooq MOB 13[birth year]1996  HM/SP -2 Everton Callas  [facility]    HM/SP -3  Carie Caddy  grandfather    HM/SP -4        HM/SP -5        HM/SP -6        HM/SP -7        HM/SP -8          Natural Supports (not living in the home):  Parent   Professional Supports: Therapist (is searching for therpaist at this time.)   Employment: Consulting civil engineer, Full-time   Type of Work: on leave from [employer] MOB also reported that she is in school getting her Master's Degree.   Education:  Attending college   Homebound arranged:  n/a  Financial Resources:  Medicaid   Other Resources:  WIC, Sales executive  (plans to apply for both once she moves out.)   Cultural/Religious Considerations Which May Impact Care:  none   Strengths:  Ability to meet basic needs , Compliance with medical plan , Home prepared for child , Pediatrician chosen   Psychotropic Medications:      None reported at this time.   Pediatrician:    [place] farm manager area  Pediatrician List:   [facility]  [place] [facility]      Pediatrician Fax Number:    Risk Factors/Current Problems:  None    Cognitive State:  Able to Concentrate , Insightful , Alert    Mood/Affect:  Comfortable , Relaxed , Calm , Bright , Interested , Happy    CSW Assessment: CSW consulted due to MOB's mental health hx. CSW went to speak with MOB at bedside to address further needs.   CSW congratulated MOB on the birth of infant. CSW advised MOB of CSW's role and the reason for CSW coming to visit with her. MOB reported that she was diagnosed with PTSD and depression in 2015. MOB reported being a freshmen in college when given these diagnosis. MOB reported that she was on medications in the past but reports no medication use within the last four years. MOB reported that she was also diagnosed with Bipolar in 2016 where she began to take medication again. MOB expressed that she has been off of those medications for some time. MOB reported that she feels that she has been doing well since stopping medication use. MOB informed CSW that she has not had any challenges with her mental health and reported that she has no SI or HI. CSW asked MOB about SI hx in which MOB expressed that in 2015 she was hospitalized at [facility] for PTSD  and depression. MOB expressed that she was then hospitalized at [facility] in 2017. MOB expressed that she has since not had any SI or feelings of SI. At the request of MOB, CSW provided MOB with therapy resources to follow up with outpatient.   CSW inquired from MOB on other mental health hx in which MOB told CSW that she was diagnosed with substance abuse. CSW inquired from [facility] on this. MOB advised CSW that she has a hx of Cocaine use in which MOB reported being in treatment in the past for this. MOB expressed no substance use while pregnant and advised CSW that she stopped using ETOH and smoking cigarettes once pregnancy was confirmed.   MOB went on to tell CSW that she has support from her mother and father. MOB expressed that she lives with both parents at home and reported that she  feels safe. MOB advised CSW that she is not enrolled in [facility] or Food Stamps but does report that she plans to apply for both once she moves out of her parents home. MOB reported that she has all other items needed to care for infant with no other concerns.   CSW took time to provide MOB With PPD and SIDS education. MOB Was given PPD Checklist in order to keep track of feelings as they may relate to PPD. MOB expressed that infant will be seen at [facility] for further care as well as MOB expressed that infant has basinet and crib for infant to sleep in once arrived home.   CSW Plan/Description:  No Further Intervention Required/No Barriers to Discharge, Perinatal Mood and Anxiety Disorder (PMADs) Education, Sudden Infant Death Syndrome (SIDS) Education    Robb Matar, LCSWA 10/13/2020, 9:57 AM

## 2020-10-13 NOTE — Lactation Note (Signed)
This note was copied from a baby's chart. Lactation Consultation Note Baby 7 hrs old.  Mom will need help training baby to BF as well as mom. Mom has semi flat nipples at rest. Needs a lot of stimulation to keep everted. Very compressible. Needs to latch in football position until mom is comfortable latching in cradle. Baby has recessed chin that needs to be pulled down. Baby will suckle a few times then pop off or flange will close. Baby is wanting to suckle.  Spoon fed 5 ml expressed colostrum.   Shells given for mom to wear this am. Hand pump given for pre-pumping before latching to assist in everting nipple. Newborn behavior, feeding habits, STS, I&O, positioning, support, supply and demand discussed. Stressed to mom not to allow baby to suckle on tip of nipple.  Mom needs assistance d/t chin tug. Mom is doing it the best she can but the baby is getting on shallow. Explained the shells will help as well as pre-pumping.  Encouraged to call for assistance or questions. Lactation brochure given.  Patient Name: [name] [name] [id number] Date: 10/13/2020 Reason for consult: Initial assessment;Primapara;Term   Maternal Data Has patient been taught Hand Expression?: Yes Does the patient have breastfeeding experience prior to this delivery?: No  Feeding Feeding Type: Breast Milk  LATCH Score Latch: Grasps breast easily, tongue down, lips flanged, rhythmical sucking.  Audible Swallowing: A few with stimulation  Type of Nipple: Flat (semi flat at rest)  Comfort (Breast/Nipple): Soft / non-tender  Hold (Positioning): Full assist, staff holds infant at breast  LATCH Score: 6  Interventions Interventions: Breast feeding basics reviewed;Support pillows;Assisted with latch;Position options;Skin to skin;Expressed milk;Breast massage;Hand express;Shells;Pre-pump if needed;Comfort gels;Reverse pressure;Hand pump;Adjust position;Breast compression  Lactation Tools  Discussed/Used Tools: Shells;Pump Shell Type: Inverted Breast pump type: Manual WIC Program: No Pump Review: Setup, frequency, and cleaning;Milk Storage Initiated by:: Peri Jefferson RN IBCLC Date initiated:: 10/13/20   Consult Status Consult Status: Follow-up Date: 10/13/20 (in pm) Follow-up type: In-patient    [name] 10/13/2020, 5:32 AM

## 2020-10-13 NOTE — Lactation Note (Signed)
This note was copied from a baby's chart. Lactation Consultation Note Baby 24 hrs old. Baby wanting to fee a lot. When LC came to room mom had baby to Rt. Breast in cross cradle position. Baby latched very shallow, needs bottom lip flanged out. Mom trying to do it and hold baby and breast. Suggested that is something FOB can do for a couple of days until baby learns how to open wide. Baby has stuffy nose and wasn't able to breathe good in a face down position. Mom in a laid back position. Suggested mom sit more up right.  Repositioned mom and baby. Baby latching shallow. Mom pulled on chin to open mouth. Baby cried. Latched baby and baby fed good.  Placed folded blanket place under mom's hand that is supporting baby's head. Mom looked tense, encouraged mom to take deep breaths and try to relax. Mom massaging breast as baby feeds. Praised mom for her hard work.  Patient Name: [name] [name] [id number] Date: 10/13/2020 Reason for consult: Follow-up assessment;Primapara;Term   Maternal Data    Feeding Feeding Type: Breast Fed  LATCH Score Latch: Repeated attempts needed to sustain latch, nipple held in mouth throughout feeding, stimulation needed to elicit sucking reflex.  Audible Swallowing: A few with stimulation  Type of Nipple: Everted at rest and after stimulation  Comfort (Breast/Nipple): Filling, red/small blisters or bruises, mild/mod discomfort (looks a little red)  Hold (Positioning): Assistance needed to correctly position infant at breast and maintain latch.  LATCH Score: 6  Interventions Interventions: Breast feeding basics reviewed;Support pillows;Assisted with latch;Position options;Skin to skin;Breast massage;Breast compression;Adjust position  Lactation Tools Discussed/Used Tools: Shells;Pump Shell Type: Inverted Breast pump type: Double-Electric Breast Pump;Manual   Consult Status Consult Status: Follow-up Date: 10/14/20 Follow-up type:  In-patient    [name] 10/13/2020, 10:24 PM

## 2020-10-13 NOTE — Progress Notes (Signed)
Pt's temp 100.5 on admission to [facility]. Notified Dorisann Frames, CNM of temp. Tylenol and Toradol given. Temp now 99.6. Per midwife, no new orders and will continue to monitor.

## 2020-10-14 MED ORDER — IBUPROFEN 800 MG PO TABS: 800.0000 mg | ORAL_TABLET | Freq: Four times a day (QID) | ORAL | 0 refills | Status: AC

## 2020-10-14 MED ORDER — POLYSACCHARIDE IRON COMPLEX 150 MG PO CAPS: 150.0000 mg | ORAL_CAPSULE | Freq: Every day | ORAL | 1 refills | Status: AC

## 2020-10-14 MED ORDER — ACETAMINOPHEN 500 MG PO TABS: 1000.0000 mg | ORAL_TABLET | Freq: Four times a day (QID) | ORAL | 0 refills | Status: AC

## 2020-10-14 MED ORDER — SENNOSIDES-DOCUSATE SODIUM 8.6-50 MG PO TABS: 2.0000 | ORAL_TABLET | ORAL | 1 refills | Status: AC

## 2020-10-14 MED ORDER — MAGNESIUM OXIDE 400 (241.3 MG) MG PO TABS: 400.0000 mg | ORAL_TABLET | Freq: Every day | ORAL | 1 refills | Status: AC

## 2020-10-14 MED ORDER — OXYCODONE HCL 5 MG PO TABS
5.0000 mg | ORAL_TABLET | ORAL | 0 refills | Status: AC | PRN
Start: 2020-10-14 — End: ?

## 2020-10-14 NOTE — Lactation Note (Signed)
This note was copied from a baby's chart. Lactation Consultation Note  Patient Name: [name] [name] [id number] Date: 10/14/2020 Reason for consult: Follow-up assessment   Mother reports that infant is feeding much better and staying on the breast longer. She denies being sore .  Mother reports that she feels infant is feeding well. She denies having any breastfeeding questions or concerns.   Discussed treatment and prevention of engorgement. Mother is unsure if she is going home today.   Mother to continue to cue base feed infant and feed at least 8-12 times or more in 24 hours and advised to allow for cluster feeding infant as needed.  Mother to continue to due STS. Mother is aware of available LC services at [facility], BFSG'S, OP Dept, and phone # for questions or concerns about breastfeeding.  Mother receptive to all teaching and plan of care.     Maternal Data    Feeding Feeding Type: Breast Fed  LATCH Score                   Interventions Interventions: Hand pump  Lactation Tools Discussed/Used     Consult Status Consult Status: Follow-up Date: 10/15/20 Follow-up type: In-patient    Stevan Born [facility] 10/14/2020, 12:05 PM

## 2020-10-14 NOTE — Discharge Instructions (Signed)

## 2020-10-14 NOTE — Discharge Summary (Signed)
OB Discharge Summary  Patient Name: [name] [name] DOB: 1996-08-16 MRN: [id number]  Date of admission: 10/12/2020 Delivering provider: Rhoderick Moody E   Admitting diagnosis: Encounter for induction of labor [Z34.90] Intrauterine pregnancy: [redacted]w[redacted]d     Secondary diagnosis: Patient Active Problem List   Diagnosis Date Noted  . Maternal anemia, with delivery - ABL 10/13/2020  . Rubella nonimmune status, delivered, current hospitalization 10/13/2020  . Encounter for induction of labor 10/12/2020  . Failed induction of labor - FITL 10/12/2020  . Status post primary low transverse cesarean section 11/5 10/12/2020  . Postpartum care following cesarean delivery 11/5 10/12/2020    Date of discharge: 10/14/2020   Discharge diagnosis: Principal Problem:   Postpartum care following cesarean delivery 11/5 Active Problems:   Encounter for induction of labor   Failed induction of labor - FITL   Status post primary low transverse cesarean section 11/5   Maternal anemia, with delivery - ABL   Rubella nonimmune status, delivered, current hospitalization                                                           Post partum procedures:None  Augmentation: AROM, Cytotec and OP Foley Pain control: Epidural  Laceration:None  Episiotomy:None  Complications: None  Hospital course:  Induction of Labor With Cesarean Section   24 y.o. yo G1P1001 at [redacted]w[redacted]d was admitted to the hospital 10/12/2020 for induction of labor. Patient had a labor course significant for fetal intolerance. The patient went for cesarean section due to Non-Reassuring FHR. Delivery details are as follows: Membrane Rupture Time/Date: 4:33 PM ,10/12/2020   Delivery Method:C-Section, Low Transverse  Details of operation can be found in separate operative Note.  Patient had an uncomplicated postpartum course. She is ambulating, tolerating a regular diet, passing flatus, and urinating well.  Patient is discharged home in stable condition on  10/14/20.      Newborn Data: Birth date:10/12/2020  Birth time:9:45 PM  Gender:Female  Living status:Living  Apgars:8 ,9  IOEVOJ:5009 g                                 Physical exam  Vitals:   10/13/20 1355 10/13/20 1620 10/13/20 2035 10/14/20 0600  BP: (!) 106/52 (!) 107/58 (!) 99/53 (!) 94/57  Pulse:  76 88 79  Resp: 16 17 18 18   Temp:  98 F (36.7 C) 98.7 F (37.1 C) 98.6 F (37 C)  TempSrc:  Oral Oral Oral  SpO2:  99% 98% 98%  Weight:      Height:       General: alert, cooperative and no distress Lochia: appropriate Uterine Fundus: firm Incision: Healing well with no significant drainage, Dressing is clean, dry, and intact DVT Evaluation: No evidence of DVT seen on physical exam. No significant calf/ankle edema. Labs: Lab Results  Component Value Date   WBC 15.6 (H) 10/13/2020   HGB 9.0 (L) 10/13/2020   HCT 27.6 (L) 10/13/2020   MCV 91.7 10/13/2020   PLT 163 10/13/2020   CMP Latest Ref Rng & Units 02/15/2017  Glucose 65 - 99 mg/dL 83  BUN 6 - 20 mg/dL 19  Creatinine 04/17/2017 - 3.81 mg/dL 8.29  Sodium 9.37 - 169 mmol/L 139  Potassium 3.5 -  5.1 mmol/L 3.6  Chloride 101 - 111 mmol/L 109  CO2 22 - 32 mmol/L 25  Calcium 8.9 - 10.3 mg/dL 9.2  Total Protein 6.5 - 8.1 g/dL 7.2  Total Bilirubin 0.3 - 1.2 mg/dL 0.7  Alkaline Phos 38 - 126 U/L 63  AST 15 - 41 U/L 14(L)  ALT 14 - 54 U/L 12(L)   Edinburgh Postnatal Depression Scale Screening Tool 10/13/2020 10/12/2020  I have been able to laugh and see the funny side of things. 0 (No Data)  I have looked forward with enjoyment to things. 0 -  I have blamed myself unnecessarily when things went wrong. 1 -  I have been anxious or worried for no good reason. 2 -  I have felt scared or panicky for no good reason. 2 -  Things have been getting on top of me. 1 -  I have been so unhappy that I have had difficulty sleeping. 0 -  I have felt sad or miserable. 1 -  I have been so unhappy that I have been crying. 1 -  The thought  of harming myself has occurred to me. 0 -  Edinburgh Postnatal Depression Scale Total 8 -    Vaccines: TDaP UTD         Flu    Declined         COVID-19   Declined  Discharge instructions:  per After Visit Summary and [facility] OB booklet  After Visit Meds:  Allergies as of 10/14/2020      Reactions   Amoxicillin Rash      Medication List    STOP taking these medications   ferrous sulfate 325 (65 FE) MG EC tablet   metroNIDAZOLE 500 MG tablet Commonly known as: Flagyl     TAKE these medications   acetaminophen 500 MG tablet Commonly known as: TYLENOL Take 2 tablets (1,000 mg total) by mouth every 6 (six) hours.   ibuprofen 800 MG tablet Commonly known as: ADVIL Take 1 tablet (800 mg total) by mouth every 6 (six) hours.   iron polysaccharides 150 MG capsule Commonly known as: Ferrex 150 Take 1 capsule (150 mg total) by mouth daily.   magnesium oxide 400 (241.3 Mg) MG tablet Commonly known as: MAG-OX Take 1 tablet (400 mg total) by mouth daily. Start taking on: October 15, 2020   multivitamin-prenatal 27-0.8 MG Tabs tablet Take 1 tablet by mouth daily at 12 noon.   oxyCODONE 5 MG immediate release tablet Commonly known as: Oxy IR/ROXICODONE Take 1-2 tablets (5-10 mg total) by mouth every 4 (four) hours as needed for moderate pain.   senna-docusate 8.6-50 MG tablet Commonly known as: Senokot-S Take 2 tablets by mouth daily. Start taking on: October 15, 2020      Diet: routine diet  Activity: Advance as tolerated. Pelvic rest for 6 weeks.   Newborn Data: Live born female  Birth Weight: 8 lb 1.6 oz (3674 g) APGAR: 8, 9  Newborn Delivery   Birth date/time: 10/12/2020 21:45:00 Delivery type: C-Section, Low Transverse Trial of labor: Yes C-section categorization: Primary     Named [name] Baby Feeding: Breast Disposition:home with mother  Delivery Report:   Review the Delivery Report for details.    Follow up:  Follow-up Information    June Leap, CNM. Schedule an appointment as soon as possible for a visit in 2 week(s).   Specialty: Certified Nurse Midwife Why: Please make an appointment for 2 weeks postpartum.  Contact information: [address]  [address] [place] [address] [phone]               June Leap, CNM, MSN 10/14/2020, 3:39 PM

## 2020-10-14 NOTE — Lactation Note (Signed)
This note was copied from a baby's chart. Lactation Consultation Note  Patient Name: [name] [name] [id number] Date: 10/14/2020 Reason for consult: Follow-up assessment   L C arrived to mothers room and mother had independently latched infant in football hold. Infant was suckling and was observed with audible swallows. Mother was taught to do breast compression.   Father at the bedside and can hear swallows. Encouraged mother to rouse infant more frequently during the day for feedings.  Infant sustained latch for 24 mins.    Plan of Care : Breastfeed infant with feeding cues Supplement infant with ebm/formula, according to supplemental guidelines. Pump using a DEBP after each feeding for 15-20 mins. Mom has a [employer] pump at home  Mother to continue to cue base feed infant and feed at least 8-12 times or more in 24 hours and advised to allow for cluster feeding infant as needed.   Mother to continue to due STS. Mother is aware of available LC services at [facility], BFSG'S, OP Dept, and phone # for questions or concerns about breastfeeding.  Mother receptive to all teaching and plan of care.     Maternal Data    Feeding Feeding Type: Breast Fed  LATCH Score Latch: Grasps breast easily, tongue down, lips flanged, rhythmical sucking.  Audible Swallowing: Spontaneous and intermittent  Type of Nipple: Everted at rest and after stimulation  Comfort (Breast/Nipple): Filling, red/small blisters or bruises, mild/mod discomfort  Hold (Positioning): No assistance needed to correctly position infant at breast.  LATCH Score: 9  Interventions Interventions: Ice  Lactation Tools Discussed/Used     Consult Status Consult Status: Complete Date: 10/15/20 Follow-up type: In-patient    Stevan Born [facility] 10/14/2020, 2:44 PM

## 2020-10-16 LAB — SURGICAL PATHOLOGY

## 2020-10-17 ENCOUNTER — Encounter (HOSPITAL_COMMUNITY)
Admission: AD | Disposition: A | Discharge status: Home / Self Care | Payer: Self-pay | Source: Home / Self Care | Attending: Obstetrics & Gynecology

## 2020-10-17 SURGERY — LIGATION, FALLOPIAN TUBE, POSTPARTUM
Anesthesia: Regional
